# Patient Record
Sex: Female | Born: 1980
Health system: Southern US, Community
[De-identification: ages and names within clinical notes are randomized; demographics above are authoritative.]

## PROBLEM LIST (undated history)

## (undated) ENCOUNTER — Emergency Department (HOSPITAL_COMMUNITY): Payer: Self-pay

## (undated) DIAGNOSIS — G43909 Migraine, unspecified, not intractable, without status migrainosus: Secondary | ICD-10-CM

## (undated) DIAGNOSIS — I499 Cardiac arrhythmia, unspecified: Secondary | ICD-10-CM

## (undated) DIAGNOSIS — N200 Calculus of kidney: Secondary | ICD-10-CM

## (undated) DIAGNOSIS — D649 Anemia, unspecified: Secondary | ICD-10-CM

## (undated) DIAGNOSIS — E039 Hypothyroidism, unspecified: Secondary | ICD-10-CM

## (undated) DIAGNOSIS — N390 Urinary tract infection, site not specified: Secondary | ICD-10-CM

## (undated) HISTORY — DX: Anemia, unspecified: D64.9

## (undated) HISTORY — DX: Urinary tract infection, site not specified: N39.0

## (undated) HISTORY — PX: WISDOM TOOTH EXTRACTION: SHX21

## (undated) HISTORY — DX: Calculus of kidney: N20.0

## (undated) HISTORY — DX: Migraine, unspecified, not intractable, without status migrainosus: G43.909

---

## 2003-08-09 ENCOUNTER — Encounter: Admission: RE | Admit: 2003-08-09 | Discharge: 2003-08-09 | Payer: Self-pay | Admitting: Family Medicine

## 2005-02-19 ENCOUNTER — Other Ambulatory Visit: Admission: RE | Admit: 2005-02-19 | Discharge: 2005-02-19 | Payer: Self-pay | Admitting: Family Medicine

## 2005-03-26 IMAGING — CT CT HEAD W/O CM
1 series · 16 of 26 positions shown, 20 images · IV contrast (agent unspecified)
Comparison: none

CLINICAL DATA: Headaches, visual symptoms. 
 CT HEAD W/O CONTRAST: 
 Routine non-contrast head CT was performed. 

 There is no evidence of intracranial hemorrhage, brain edema, or mass effect. The ventricles are normal. No extra-axial abnormalities are identified. Bone windows show no significant abnormalities.
 IMPRESSION
 Negative non-contrast head CT. 
 ATTN:  Miltraud Pontu

[Series 2: brain · axial · 0.49mm/px · z∈[+72,+194]mm · 16 of 26 slices shown, 20 images]
[im 2/26  brain]
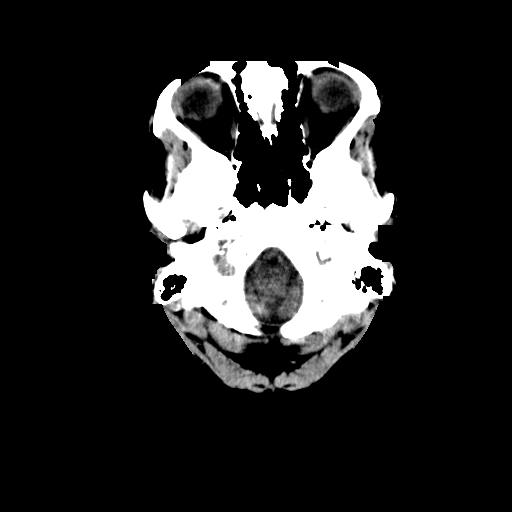
[im 2/26  bone]
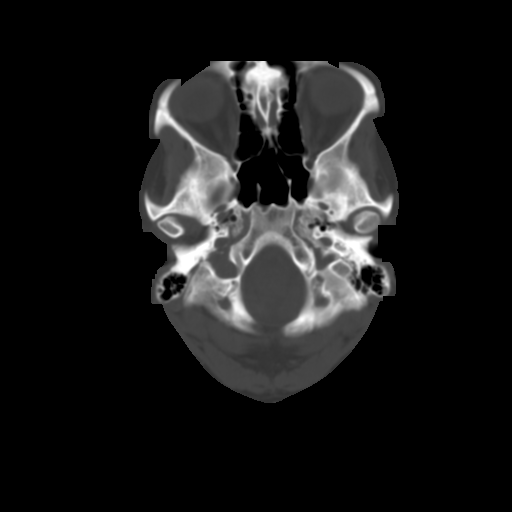
[im 4/26  brain]
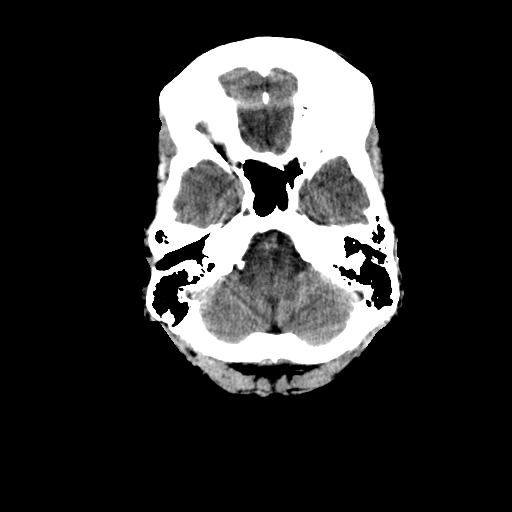
[im 5/26  brain]
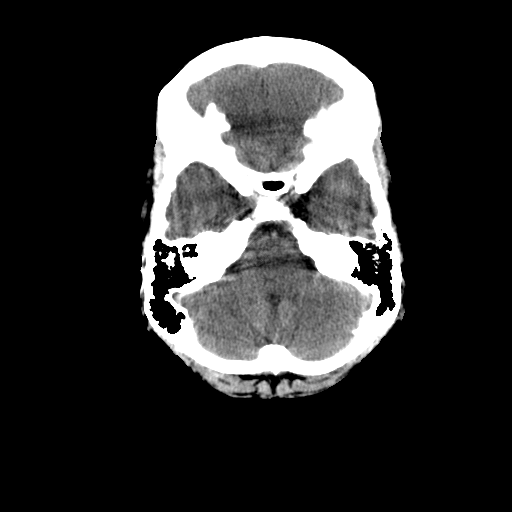
[im 7/26  brain]
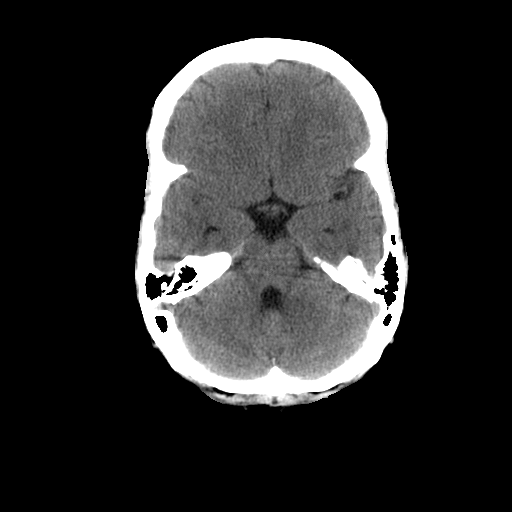
[im 8/26  brain]
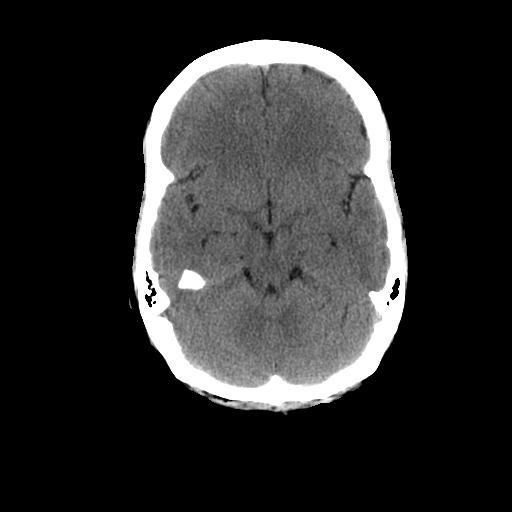
[im 8/26  bone]
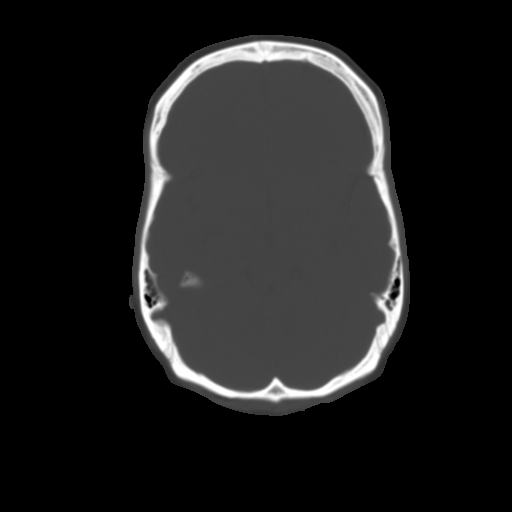
[im 10/26  brain]
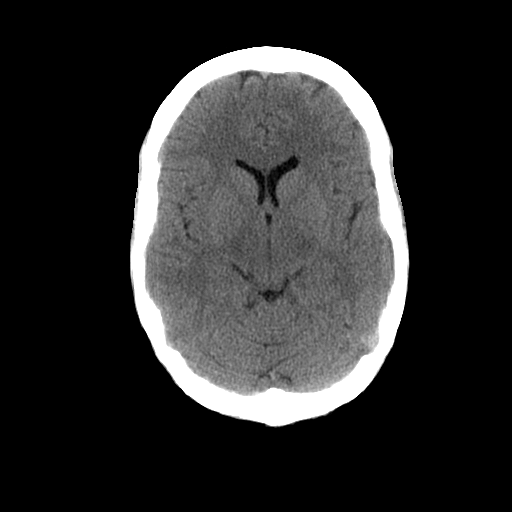
[im 11/26  brain]
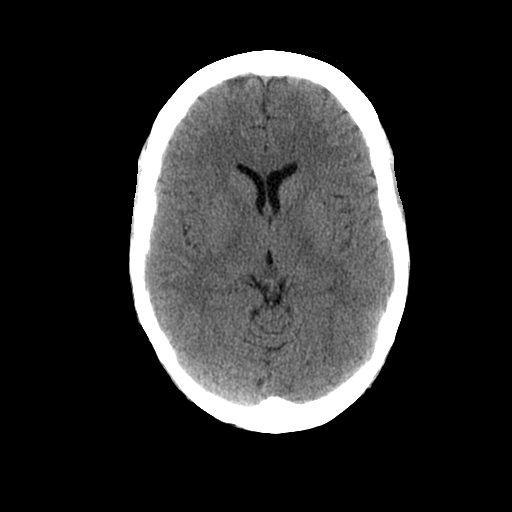
[im 13/26  brain]
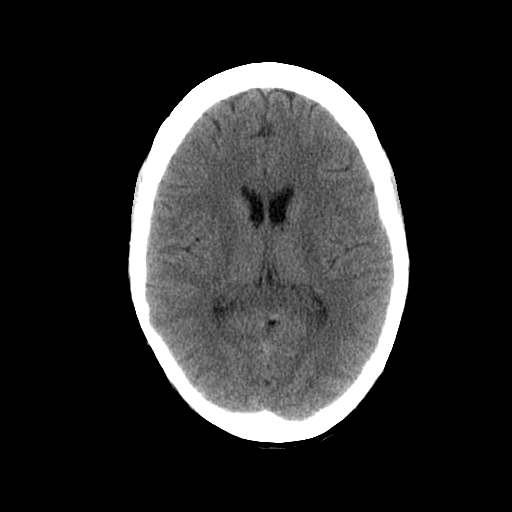
[im 14/26  brain]
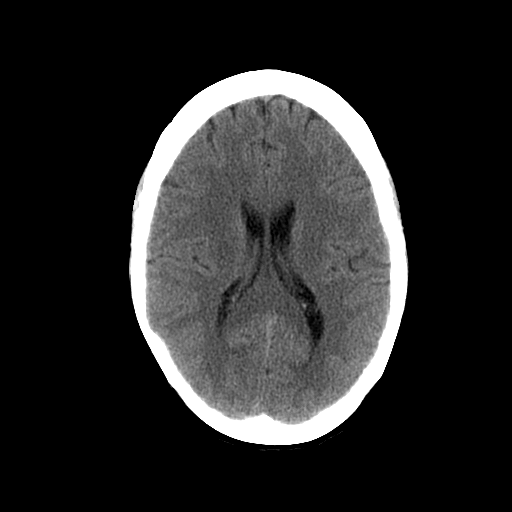
[im 14/26  bone]
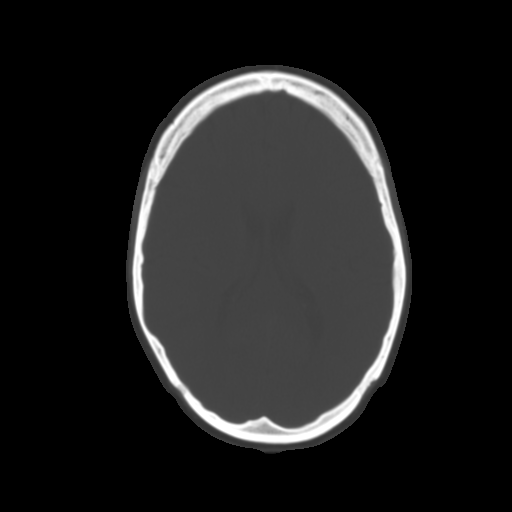
[im 16/26  brain]
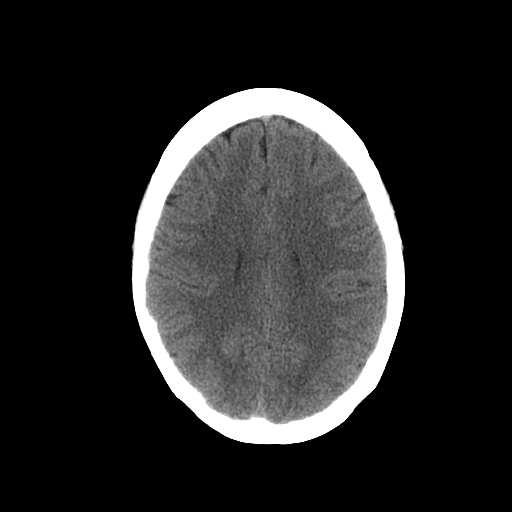
[im 17/26  brain]
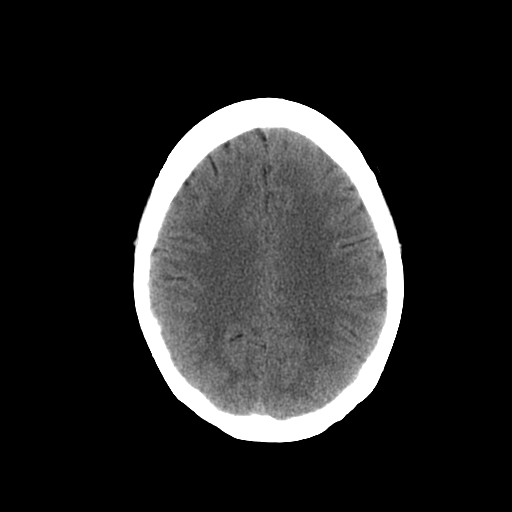
[im 19/26  brain]
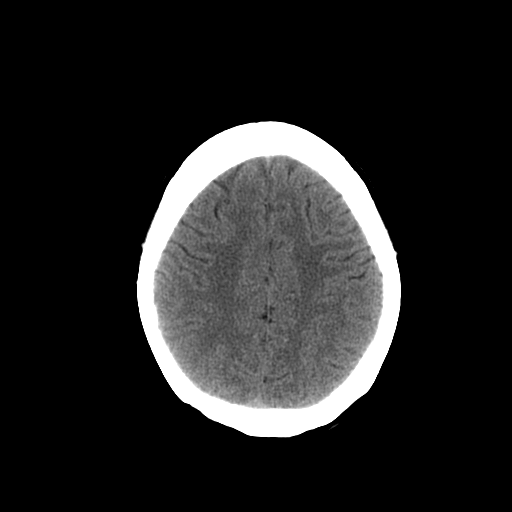
[im 20/26  brain]
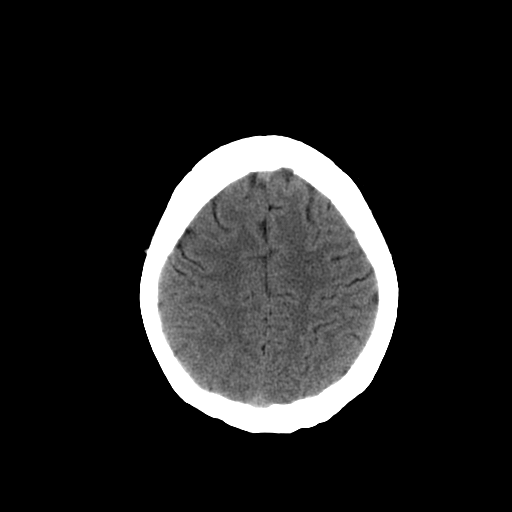
[im 20/26  bone]
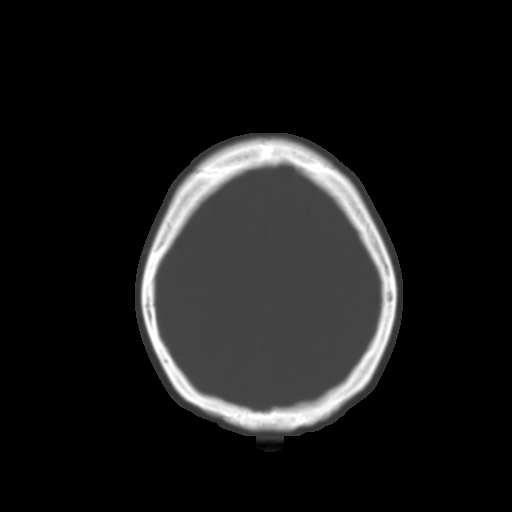
[im 22/26  brain]
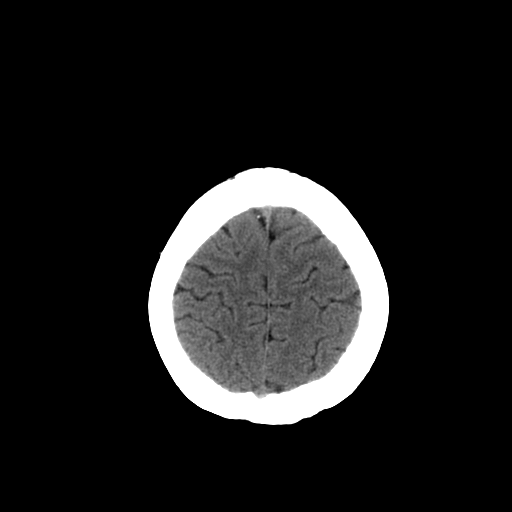
[im 23/26  brain]
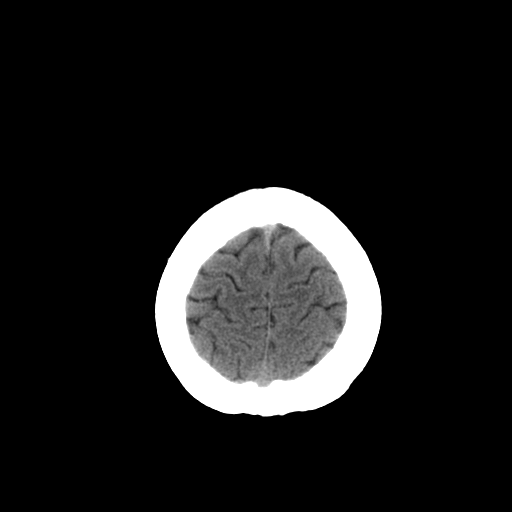
[im 25/26  brain]
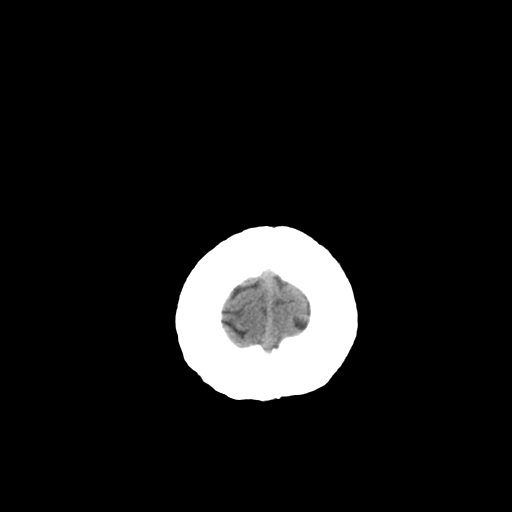

[16 of 26 positions shown; findings below may reference images not displayed]

## 2006-06-25 ENCOUNTER — Other Ambulatory Visit: Admission: RE | Admit: 2006-06-25 | Discharge: 2006-06-25 | Payer: Self-pay | Admitting: Family Medicine

## 2010-07-31 ENCOUNTER — Emergency Department: Payer: Self-pay | Admitting: Emergency Medicine

## 2012-04-24 ENCOUNTER — Emergency Department: Payer: Self-pay | Admitting: Emergency Medicine

## 2014-06-01 ENCOUNTER — Encounter: Payer: Self-pay | Admitting: Family Medicine

## 2014-06-01 ENCOUNTER — Encounter (INDEPENDENT_AMBULATORY_CARE_PROVIDER_SITE_OTHER): Payer: Self-pay

## 2014-06-01 ENCOUNTER — Ambulatory Visit: Payer: Self-pay | Admitting: Family Medicine

## 2014-06-01 ENCOUNTER — Ambulatory Visit (INDEPENDENT_AMBULATORY_CARE_PROVIDER_SITE_OTHER): Payer: BC Managed Care – PPO | Admitting: Family Medicine

## 2014-06-01 VITALS — BP 102/62 | HR 78 | Temp 98.5°F | Ht 69.75 in | Wt 125.5 lb

## 2014-06-01 DIAGNOSIS — R946 Abnormal results of thyroid function studies: Secondary | ICD-10-CM

## 2014-06-01 DIAGNOSIS — N390 Urinary tract infection, site not specified: Secondary | ICD-10-CM | POA: Insufficient documentation

## 2014-06-01 DIAGNOSIS — R7989 Other specified abnormal findings of blood chemistry: Secondary | ICD-10-CM | POA: Insufficient documentation

## 2014-06-01 DIAGNOSIS — Z87442 Personal history of urinary calculi: Secondary | ICD-10-CM | POA: Insufficient documentation

## 2014-06-01 DIAGNOSIS — Z720 Tobacco use: Secondary | ICD-10-CM

## 2014-06-01 DIAGNOSIS — H0012 Chalazion right lower eyelid: Secondary | ICD-10-CM | POA: Insufficient documentation

## 2014-06-01 DIAGNOSIS — H00019 Hordeolum externum unspecified eye, unspecified eyelid: Secondary | ICD-10-CM | POA: Insufficient documentation

## 2014-06-01 DIAGNOSIS — R21 Rash and other nonspecific skin eruption: Secondary | ICD-10-CM

## 2014-06-01 LAB — T4, FREE: Free T4: 0.78 ng/dL (ref 0.60–1.60)

## 2014-06-01 LAB — TSH: TSH: 2.17 u[IU]/mL (ref 0.35–4.50)

## 2014-06-01 MED ORDER — VITAMIN D (ERGOCALCIFEROL) 1.25 MG (50000 UNIT) PO CAPS: ORAL_CAPSULE | ORAL | Status: DC

## 2014-06-01 MED ORDER — TRIAMCINOLONE ACETONIDE 0.1 % EX CREA: 1.0000 "application " | TOPICAL_CREAM | Freq: Two times a day (BID) | CUTANEOUS | Status: DC

## 2014-06-01 NOTE — Assessment & Plan Note (Signed)
Since failed topical lamisil and only mildly pruritic, I question if this is possibly eczema. Advised to d/c Lamisil, eRx sent for Triamcinolone twice daily x 10 days. She will call me with an update. If no improvement, will biopsy and or start stronger antifungal. The patient indicates understanding of these issues and agrees with the plan.

## 2014-06-01 NOTE — Patient Instructions (Addendum)
Chalazion A chalazion is a swelling or hard lump on the eyelid caused by a blocked oil gland. Chalazions may occur on the upper or the lower eyelid.  CAUSES  Oil gland in the eyelid becomes blocked. SYMPTOMS   Swelling or hard lump on the eyelid. This lump may make it hard to see out of the eye.  The swelling may spread to areas around the eye. TREATMENT   Although some chalazions disappear by themselves in 1 or 2 months, some chalazions may need to be removed.  Medicines to treat an infection may be required. HOME CARE INSTRUCTIONS   Wash your hands often and dry them with a clean towel. Do not touch the chalazion.  Apply heat to the eyelid several times a day for 10 minutes to help ease discomfort and bring any yellowish white fluid (pus) to the surface. One way to apply heat to a chalazion is to use the handle of a metal spoon.  Hold the handle under hot water until it is hot, and then wrap the handle in paper towels so that the heat can come through without burning your skin.  Hold the wrapped handle against the chalazion and reheat the spoon handle as needed.  Apply heat in this fashion for 10 minutes, 4 times per day.  Return to your caregiver to have the pus removed if it does not break (rupture) on its own.  Do not try to remove the pus yourself by squeezing the chalazion or sticking it with a pin or needle.  Only take over-the-counter or prescription medicines for pain, discomfort, or fever as directed by your caregiver. SEEK IMMEDIATE MEDICAL CARE IF:   You have pain in your eye.  Your vision changes.  The chalazion does not go away.  The chalazion becomes painful, red, or swollen, grows larger, or does not start to disappear after 2 weeks. MAKE SURE YOU:   Understand these instructions.  Will watch your condition.  Will get help right away if you are not doing well or get worse. Document Released: 06/07/2000 Document Revised: 09/02/2011 Document Reviewed:  09/25/2009 [facility] Patient Information 2015 [place], [place]. This information is not intended to replace advice given to you by your health care provider. Make sure you discuss any questions you have with your health care provider.  Please call your doctor about the chalazion and keep me posted.  Apply triamcinolone twice on the area for no more than 10 days.  Take vitamin D- 1 tab by mouth weekly x 6 weeks, , then continue VitD 1600 IU daily.

## 2014-06-01 NOTE — Assessment & Plan Note (Signed)
New-Will treat with high dose supplementation- 50,000 IU weekly x 6 weeks, then 1600 IU daily. The patient indicates understanding of these issues and agrees with the plan.

## 2014-06-01 NOTE — Progress Notes (Signed)
Pre visit review using our clinic review tool, if applicable. No additional management support is needed unless otherwise documented below in the visit note. 

## 2014-06-01 NOTE — Assessment & Plan Note (Signed)
New- advised follow up with eye doctor or derm for removal. Given duration of symptoms, will likely need removal at this point since it has not been resolving. The patient indicates understanding of these issues and agrees with the plan. She will call directly to make an appt since she is already established.

## 2014-06-01 NOTE — Assessment & Plan Note (Signed)
New- recheck thyroid panel today. Has been complaining of dry skin and fatigue. Orders Placed This Encounter  Procedures  . TSH  . T4, Free

## 2014-06-01 NOTE — Progress Notes (Signed)
Subjective:   Patient ID: Nicole MarekAmanda G [name], female    DOB: [birth year], 33 y.o.   MRN: [id number]  Nicole Marekmanda G [name] is a pleasant 33 y.o. year old female who presents to clinic today with Establish Care; Stye; and Rash  on 06/01/2014  HPI: Right "eye stye"- ongoing for over a year- firm bump on bottom of eye lid- sometimes red and warm but currently is not.  No pain in her eye, no eye redness, no photophobia.  Rash on her back- has been there since 07/2013.  Was told by previous pcp to use OTC lamisil.  Has had no improvement.  Intermittently itchy. Has not spread.  Has it no where else on her body.  Brings labs in from 10/2013 has has question.  High TSH- TSH was 5.9.  She has noticed dried lips and skin.  No other symptoms of hyper or hypothyroidism.  Vit D 25.7- no replacement was recommended.  No current outpatient prescriptions on file prior to visit.   No current facility-administered medications on file prior to visit.    No Known Allergies  Past Medical History  Diagnosis Date  . Kidney stones   . Migraines   . UTI (urinary tract infection)     Past Surgical History  Procedure Laterality Date  . Wisdom tooth extraction      Family History  Problem Relation Age of Onset  . Hypertension Mother     History   Social History  . Marital Status: Married    Spouse Name: N/A    Number of Children: N/A  . Years of Education: N/A   Occupational History  . Not on file.   Social History Main Topics  . Smoking status: Current Every Day Smoker  . Smokeless tobacco: Never Used  . Alcohol Use: Yes  . Drug Use: No  . Sexual Activity: Yes   Other Topics Concern  . Not on file   Social History Narrative  . No narrative on file   The PMH, PSH, Social History, Family History, Medications, and allergies [facility], and have been updated if relevant.   Review of Systems  Constitutional: Positive for fatigue. Negative for fever.  HENT: Negative.     Eyes: Negative for photophobia, pain, discharge, redness, itching and visual disturbance.  Respiratory: Negative.   Cardiovascular: Negative.   Gastrointestinal: Negative.   Endocrine: Negative.   Musculoskeletal: Negative.   Skin: Positive for rash.  Allergic/Immunologic: Negative.   Neurological: Negative.   Hematological: Negative.   Psychiatric/Behavioral: Negative.   All other systems reviewed and are negative.      Objective:    BP 102/62 mmHg  Pulse 78  Temp(Src) 98.5 F (36.9 C) (Oral)  Ht 5' 9.75" (1.772 m)  Wt 125 lb 8 oz (56.926 kg)  BMI 18.13 kg/m2  SpO2 99%   Physical Exam  Constitutional: She is oriented to person, place, and time. She appears well-developed and well-nourished. No distress.  HENT:  Head: Normocephalic.  Eyes: Conjunctivae and EOM are normal. Pupils are equal, round, and reactive to light. Right eye exhibits no chemosis, no discharge and no exudate. Left eye exhibits no chemosis, no discharge and no exudate. No scleral icterus.    Neck: Normal range of motion. Neck supple.  Cardiovascular: Normal rate, regular rhythm and normal heart sounds.   Pulmonary/Chest: Effort normal and breath sounds normal.  Musculoskeletal: Normal range of motion.  Lymphadenopathy:    She has no cervical adenopathy.  Neurological: She  is alert and oriented to person, place, and time. No cranial nerve deficit.  Skin: Skin is warm and dry.     Psychiatric: She has a normal mood and affect. Her speech is normal and behavior is normal. Judgment and thought content normal. Cognition and memory are normal.          Assessment & Plan:   Rash and nonspecific skin eruption  Stye, unspecified laterality  Tobacco abuse  History of nephrolithiasis  Frequent UTI  Chalazion of right lower eyelid  Elevated TSH - Plan: TSH, T4, Free  Low serum vitamin D No Follow-up on file.

## 2014-06-02 ENCOUNTER — Encounter: Payer: Self-pay | Admitting: *Deleted

## 2014-06-07 ENCOUNTER — Encounter: Payer: Self-pay | Admitting: Family Medicine

## 2014-06-08 ENCOUNTER — Ambulatory Visit: Payer: Self-pay | Admitting: Family Medicine

## 2014-06-09 ENCOUNTER — Ambulatory Visit: Payer: Self-pay | Admitting: Family Medicine

## 2014-08-04 ENCOUNTER — Ambulatory Visit: Payer: BC Managed Care – PPO | Admitting: Family Medicine

## 2014-10-12 ENCOUNTER — Telehealth: Payer: Self-pay | Admitting: Family Medicine

## 2014-10-12 NOTE — Telephone Encounter (Signed)
Patient called to schedule a pap smear and she wants to know if Dr. Dayton MartesAron wants her to have a physical and lab work,also.  Please contact patient with response.

## 2014-10-13 NOTE — Telephone Encounter (Signed)
Yes I would advise that she have a CPX with labs as well.

## 2014-10-13 NOTE — Telephone Encounter (Signed)
Spoke to pt and advised per Dr Dayton MartesAron; pt verbally expressed understanding and was already scheduled for CPE

## 2014-11-02 ENCOUNTER — Encounter: Payer: Self-pay | Admitting: Family Medicine

## 2014-11-02 ENCOUNTER — Other Ambulatory Visit (HOSPITAL_COMMUNITY)
Admission: RE | Admit: 2014-11-02 | Discharge: 2014-11-02 | Disposition: A | Discharge status: Home / Self Care | Payer: BLUE CROSS/BLUE SHIELD | Source: Ambulatory Visit | Attending: Family Medicine | Admitting: Family Medicine

## 2014-11-02 ENCOUNTER — Ambulatory Visit (INDEPENDENT_AMBULATORY_CARE_PROVIDER_SITE_OTHER): Payer: BLUE CROSS/BLUE SHIELD | Admitting: Family Medicine

## 2014-11-02 VITALS — BP 112/66 | HR 87 | Temp 98.3°F | Ht 70.25 in | Wt 126.0 lb

## 2014-11-02 DIAGNOSIS — R7989 Other specified abnormal findings of blood chemistry: Secondary | ICD-10-CM

## 2014-11-02 DIAGNOSIS — Z124 Encounter for screening for malignant neoplasm of cervix: Secondary | ICD-10-CM | POA: Insufficient documentation

## 2014-11-02 DIAGNOSIS — Z113 Encounter for screening for infections with a predominantly sexual mode of transmission: Secondary | ICD-10-CM | POA: Diagnosis present

## 2014-11-02 DIAGNOSIS — Z0000 Encounter for general adult medical examination without abnormal findings: Secondary | ICD-10-CM | POA: Diagnosis not present

## 2014-11-02 DIAGNOSIS — Z01419 Encounter for gynecological examination (general) (routine) without abnormal findings: Secondary | ICD-10-CM

## 2014-11-02 DIAGNOSIS — Z1151 Encounter for screening for human papillomavirus (HPV): Secondary | ICD-10-CM | POA: Insufficient documentation

## 2014-11-02 DIAGNOSIS — Z114 Encounter for screening for human immunodeficiency virus [HIV]: Secondary | ICD-10-CM

## 2014-11-02 DIAGNOSIS — N760 Acute vaginitis: Secondary | ICD-10-CM | POA: Diagnosis present

## 2014-11-02 DIAGNOSIS — K580 Irritable bowel syndrome with diarrhea: Secondary | ICD-10-CM | POA: Diagnosis not present

## 2014-11-02 DIAGNOSIS — R197 Diarrhea, unspecified: Secondary | ICD-10-CM

## 2014-11-02 LAB — COMPREHENSIVE METABOLIC PANEL
ALBUMIN: 4 g/dL (ref 3.5–5.2)
ALT: 12 U/L (ref 0–35)
AST: 26 U/L (ref 0–37)
Alkaline Phosphatase: 65 U/L (ref 39–117)
BUN: 11 mg/dL (ref 6–23)
CALCIUM: 9.5 mg/dL (ref 8.4–10.5)
CHLORIDE: 105 meq/L (ref 96–112)
CO2: 27 meq/L (ref 19–32)
Creatinine, Ser: 0.61 mg/dL (ref 0.40–1.20)
GFR: 117.77 mL/min (ref >60.00)
GLUCOSE: 83 mg/dL (ref 70–99)
POTASSIUM: 3.9 meq/L (ref 3.5–5.1)
SODIUM: 140 meq/L (ref 135–145)
TOTAL PROTEIN: 6.8 g/dL (ref 6.0–8.3)
Total Bilirubin: 0.4 mg/dL (ref 0.2–1.2)

## 2014-11-02 LAB — CBC WITH DIFFERENTIAL/PLATELET
BASOS ABS: 0 10*3/uL (ref 0.0–0.1)
BASOS PCT: 0.5 % (ref 0.0–3.0)
EOS ABS: 0.1 10*3/uL (ref 0.0–0.7)
Eosinophils Relative: 2.2 % (ref 0.0–5.0)
HCT: 36.2 % (ref 36.0–46.0)
Hemoglobin: 12 g/dL (ref 12.0–15.0)
LYMPHS PCT: 40.1 % (ref 12.0–46.0)
Lymphs Abs: 1.8 10*3/uL (ref 0.7–4.0)
MCHC: 33.2 g/dL (ref 30.0–36.0)
MCV: 114.1 fl — AB (ref 78.0–100.0)
MONO ABS: 0.3 10*3/uL (ref 0.1–1.0)
Monocytes Relative: 6.8 % (ref 3.0–12.0)
NEUTROS PCT: 50.4 % (ref 43.0–77.0)
Neutro Abs: 2.3 10*3/uL (ref 1.4–7.7)
PLATELETS: 239 10*3/uL (ref 150.0–400.0)
RBC: 3.17 Mil/uL — AB (ref 3.87–5.11)
RDW: 14.8 % (ref 11.5–15.5)
WBC: 4.5 10*3/uL (ref 4.0–10.5)

## 2014-11-02 LAB — LDL CHOLESTEROL, DIRECT: Direct LDL: 47 mg/dL

## 2014-11-02 LAB — LIPID PANEL
Cholesterol: 156 mg/dL (ref 0–200)
HDL: 64.4 mg/dL (ref >39.00)
NonHDL: 91.6
TRIGLYCERIDES: 376 mg/dL — AB (ref 0.0–149.0)
Total CHOL/HDL Ratio: 2
VLDL: 75.2 mg/dL — AB (ref 0.0–40.0)

## 2014-11-02 LAB — TSH: TSH: 4.73 u[IU]/mL — AB (ref 0.35–4.50)

## 2014-11-02 MED ORDER — LOPERAMIDE HCL 2 MG PO CAPS: ORAL_CAPSULE | ORAL | Status: DC

## 2014-11-02 NOTE — Addendum Note (Signed)
Addended by: Desmond DikeKNIGHT, Linton Stolp H on: 11/02/2014 11:28 AM   Modules accepted: Orders

## 2014-11-02 NOTE — Progress Notes (Signed)
Subjective:   Patient ID: Nicole MarekAmanda G [name], female    DOB: 1980/12/10, 34 y.o.   MRN: [id number]  Nicole Marekmanda G [name] is a pleasant 34 y.o. year old female who presents to clinic today with Annual Exam and Diarrhea  on 11/02/2014  HPI:  I have not seen her since she established care with me in 05/2014.  Last pap smear several years ago. No h/o abnormal pap smear within past 5 years. Does have irregular periods- stopped OCPs/hormone patch because she is a smoker. Not ready to quit.  No family history of breast, uterine, or cervical cancer.  Diarrhea- long term issue- ongoing for years.  On most days, approximately 10 minutes after she eats, feels urgency to have BM.  BM is soft/loose but not typically watery.  Worsened by anxiety and dairy.  Not sure if anything makes it better.  Denies any bloody or black stools.  Has not noticed any mucous in her stools. Lab Results  Component Value Date   TSH 2.17 06/01/2014   No results found for: CHOL, HDL, LDLCALC, LDLDIRECT, TRIG, CHOLHDL   No current outpatient prescriptions on file prior to visit.   No current facility-administered medications on file prior to visit.    No Known Allergies  Past Medical History  Diagnosis Date  . Kidney stones   . Migraines   . UTI (urinary tract infection)     Past Surgical History  Procedure Laterality Date  . Wisdom tooth extraction      Family History  Problem Relation Age of Onset  . Hypertension Mother     History   Social History  . Marital Status: Married    Spouse Name: N/A  . Number of Children: N/A  . Years of Education: N/A   Occupational History  . Not on file.   Social History Main Topics  . Smoking status: Current Every Day Smoker  . Smokeless tobacco: Never Used  . Alcohol Use: Yes  . Drug Use: No  . Sexual Activity: Yes   Other Topics Concern  . Not on file   Social History Narrative   The PMH, PSH, Social History, Family History, Medications, and allergies  [facility], and have been updated if relevant.  Review of Systems  Constitutional: Negative.   HENT: Negative.   Respiratory: Negative.   Cardiovascular: Negative.   Gastrointestinal: Positive for diarrhea. Negative for nausea, vomiting, abdominal pain, constipation, blood in stool, abdominal distention, anal bleeding and rectal pain.  Endocrine: Negative.   Genitourinary: Positive for menstrual problem. Negative for dysuria, urgency, hematuria, decreased urine volume, vaginal bleeding, vaginal discharge, enuresis and vaginal pain.  Musculoskeletal: Negative.   Skin: Negative.   Allergic/Immunologic: Negative.   Neurological: Negative.   Hematological: Negative.   Psychiatric/Behavioral: Negative.   All other systems reviewed and are negative.      Objective:    BP 112/66 mmHg  Pulse 87  Temp(Src) 98.3 F (36.8 C) (Oral)  Ht 5' 10.25" (1.784 m)  Wt 126 lb (57.153 kg)  BMI 17.96 kg/m2  SpO2 99%  LMP 10/26/2014 (Within Days)   Physical Exam    General:  Well-developed,well-nourished,in no acute distress; alert,appropriate and cooperative throughout examination Head:  normocephalic and atraumatic.   Eyes:  vision grossly intact, pupils equal, pupils round, and pupils reactive to light.   Ears:  R ear normal and L ear normal.   Nose:  no external deformity.   Mouth:  good dentition.   Neck:  No  deformities, masses, or tenderness noted. Breasts:  No mass, nodules, thickening, tenderness, bulging, retraction, inflamation, nipple discharge or skin changes noted.   Lungs:  Normal respiratory effort, chest expands symmetrically. Lungs are clear to auscultation, no crackles or wheezes. Heart:  Normal rate and regular rhythm. S1 and S2 normal without gallop, murmur, click, rub or other extra sounds. Abdomen:  Bowel sounds positive,abdomen soft and non-tender without masses, organomegaly or hernias noted. Rectal:  no external abnormalities.   Genitalia:  Pelvic  Exam:        External: normal female genitalia without lesions or masses        Vagina: normal without lesions or masses        Cervix: normal without lesions or masses        Adnexa: normal bimanual exam without masses or fullness        Uterus: normal by palpation        Pap smear: performed Msk:  No deformity or scoliosis noted of thoracic or lumbar spine.   Extremities:  No clubbing, cyanosis, edema, or deformity noted with normal full range of motion of all joints.   Neurologic:  alert & oriented X3 and gait normal.   Skin:  Intact without suspicious lesions or rashes Cervical Nodes:  No lymphadenopathy noted Axillary Nodes:  No palpable lymphadenopathy Psych:  Cognition and judgment appear intact. Alert and cooperative with normal attention span and concentration. No apparent delusions, illusions, hallucinations      Assessment & Plan:   Diarrhea  Well woman exam with routine gynecological exam - Plan: CBC with Differential/Platelet, Comprehensive metabolic panel, Lipid panel, TSH  Screening for HIV (human immunodeficiency virus) - Plan: HIV antibody (with reflex)  Screening for STD (sexually transmitted disease) - Plan: RPR, HSV(herpes simplex vrs) 1+2 ab-IgM No Follow-up on file.

## 2014-11-02 NOTE — Assessment & Plan Note (Signed)
Symptoms consistent with IBS- D. She is not currently having any red flag symptoms. Advised continuing to limit dairy and eRx sent for loperamide 2 mg to use 45 minutes prior to meals. Call or return to clinic prn if these symptoms worsen or fail to improve as anticipated. Will refer to GI if symptoms persist. The patient indicates understanding of these issues and agrees with the plan.

## 2014-11-02 NOTE — Progress Notes (Signed)
Pre visit review using our clinic review tool, if applicable. No additional management support is needed unless otherwise documented below in the visit note. 

## 2014-11-02 NOTE — Assessment & Plan Note (Signed)
Reviewed preventive care protocols, scheduled due services, and updated immunizations Discussed nutrition, exercise, diet, and healthy lifestyle.  Pap smear done today.  Labs as well.  Orders Placed This Encounter  Procedures  . HIV antibody (with reflex)  . RPR  . CBC with Differential/Platelet  . Comprehensive metabolic panel  . Lipid panel  . TSH  . HSV(herpes simplex vrs) 1+2 ab-IgM

## 2014-11-02 NOTE — Patient Instructions (Signed)
Diet and Irritable Bowel Syndrome  No cure has been found for irritable bowel syndrome (IBS). Many options are available to treat the symptoms. Your caregiver will give you the best treatments available for your symptoms. He or she will also encourage you to manage stress and to make changes to your diet. You need to work with your caregiver and Registered Dietician to find the best combination of medicine, diet, counseling, and support to control your symptoms. The following are some diet suggestions. FOODS THAT MAKE IBS WORSE  Fatty foods, such as French fries.  Milk products, such as cheese or ice cream.  Chocolate.  Alcohol.  Caffeine (found in coffee and some sodas).  Carbonated drinks, such as soda. If certain foods cause symptoms, you should eat less of them or stop eating them. FOOD JOURNAL   Keep a journal of the foods that seem to cause distress. Write down:  What you are eating during the day and when.  What problems you are having after eating.  When the symptoms occur in relation to your meals.  What foods always make you feel badly.  Take your notes with you to your caregiver to see if you should stop eating certain foods. FOODS THAT MAKE IBS BETTER Fiber reduces IBS symptoms, especially constipation, because it makes stools soft, bulky, and easier to pass. Fiber is found in bran, bread, cereal, beans, fruit, and vegetables. Examples of foods with fiber include:  Apples.  Peaches.  Pears.  Berries.  Figs.  Broccoli, raw.  Cabbage.  Carrots.  Raw peas.  Kidney beans.  Lima beans.  Whole-grain bread.  Whole-grain cereal. Add foods with fiber to your diet a little at a time. This will let your body get used to them. Too much fiber at once might cause gas and swelling of your abdomen. This can trigger symptoms in a person with IBS. Caregivers usually recommend a diet with enough fiber to produce soft, painless bowel movements. High fiber diets may  cause gas and bloating. However, these symptoms often go away within a few weeks, as your body adjusts. In many cases, dietary fiber may lessen IBS symptoms, particularly constipation. However, it may not help pain or diarrhea. High fiber diets keep the colon mildly enlarged (distended) with the added fiber. This may help prevent spasms in the colon. Some forms of fiber also keep water in the stool, thereby preventing hard stools that are difficult to pass.  Besides telling you to eat more foods with fiber, your caregiver may also tell you to get more fiber by taking a fiber pill or drinking water mixed with a special high fiber powder. An example of this is a natural fiber laxative containing psyllium seed.  TIPS  Large meals can cause cramping and diarrhea in people with IBS. If this happens to you, try eating 4 or 5 small meals a day, or try eating less at each of your usual 3 meals. It may also help if your meals are low in fat and high in carbohydrates. Examples of carbohydrates are pasta, rice, whole-grain breads and cereals, fruits, and vegetables.  If dairy products cause your symptoms to flare up, you can try eating less of those foods. You might be able to handle yogurt better than other dairy products, because it contains bacteria that helps with digestion. Dairy products are an important source of calcium and other nutrients. If you need to avoid dairy products, be sure to talk with a Registered Dietitian about getting these nutrients   through other food sources.  Drink enough water and fluids to keep your urine clear or pale yellow. This is important, especially if you have diarrhea. FOR MORE INFORMATION  International Foundation for Functional Gastrointestinal Disorders: www.iffgd.org  National Digestive Diseases Information Clearinghouse: digestive.niddk.nih.gov Document Released: 08/31/2003 Document Revised: 09/02/2011 Document Reviewed: 09/10/2013 ExitCare Patient Information 2015  [employer]. This information is not intended to replace advice given to you by your health care provider. Make sure you discuss any questions you have with your health care provider.  

## 2014-11-03 LAB — CYTOLOGY - PAP

## 2014-11-03 LAB — HIV ANTIBODY (ROUTINE TESTING W REFLEX): HIV: NONREACTIVE

## 2014-11-03 LAB — RPR

## 2014-11-04 ENCOUNTER — Other Ambulatory Visit: Payer: Self-pay | Admitting: Family Medicine

## 2014-11-04 DIAGNOSIS — R7989 Other specified abnormal findings of blood chemistry: Secondary | ICD-10-CM

## 2014-11-04 DIAGNOSIS — D7589 Other specified diseases of blood and blood-forming organs: Secondary | ICD-10-CM

## 2014-11-04 LAB — CERVICOVAGINAL ANCILLARY ONLY: Candida vaginitis: NEGATIVE

## 2014-11-04 LAB — HSV(HERPES SIMPLEX VRS) I + II AB-IGM: HERPES SIMPLEX VRS I-IGM AB (EIA): 1.09 {index}

## 2014-11-09 LAB — CERVICOVAGINAL ANCILLARY ONLY: HERPES (WINDOWPATH): NEGATIVE

## 2015-10-04 ENCOUNTER — Telehealth: Payer: Self-pay | Admitting: Family Medicine

## 2015-10-04 NOTE — Telephone Encounter (Signed)
Pt has appt to see Dr Dayton MartesAron on 10/09/15 at 9 AM.

## 2015-10-04 NOTE — Telephone Encounter (Signed)
PLEASE NOTE: All timestamps contained within this report are represented as [place] Standard Time. CONFIDENTIALTY NOTICE: This fax transmission is intended only for the addressee. It contains information that is legally privileged, confidential or otherwise protected from use or disclosure. If you are not the intended recipient, you are strictly prohibited from reviewing, disclosing, copying using or disseminating any of this information or taking any action in reliance on or regarding this information. If you have received this fax in error, please notify us immediately by telephone so that we can arrange for its return to [place]. Phone: [phone], Toll-Free: [phone], Fax: [phone] Page: 1 of 2 Call Id: [id number] [facility] Day - Client TELEPHONE ADVICE RECORD [facility] Patient Name: Nicole [name] Gender: Female DOB: 1981-01-22 Age: 35 Y 2 M 15 D Return Phone Number: [phone] (Primary) Address: [address] City/State/Zip: [place] [address] Client [facility] Day - Client Client Site [facility] [place] - Day Physician Ruthe Mannan Contact Type Call Who Is Calling Patient / Member / Family / Caregiver Call Type Triage / Clinical Relationship To Patient Self Return Phone Number [phone] (Primary) Chief Complaint Heart palpitations or irregular heartbeat Reason for Call Symptomatic / Request for Health Information Initial Comment Caller states wants appt w/Dr Dayton Martes; has rapid heart rate every now and again; past 3 wks swelling in ankles and feet; never goes completely away but better after having slept the night; worsens as the day progresses; also revisit IBS conversation had year ago; recommendations are not working; Appointment Disposition EMR Appointment Scheduled Info pasted into Epic Yes PreDisposition Call Doctor Translation No Nurse Assessment Nurse: Logan Bores, RN, Melissa  Date/Time (Eastern Time): 10/04/2015 3:02:27 PM Confirm and document reason for call. If symptomatic, describe symptoms. You must click the next button to save text entered. ---Caller states wants appt w/Dr Dayton Martes; has rapid heart rate every now and again; past 3 wks swelling in ankles and feet; never goes completely away but better after having slept the night; worsens as the day progresses; also revisit IBS conversation had year ago; recommendations are not working; Has the patient traveled out of the country within the last 30 days? ---Not Applicable Does the patient have any new or worsening symptoms? ---Yes Will a triage be completed? ---Yes Related visit to physician within the last 2 weeks? ---No Does the PT have any chronic conditions? (i.e. diabetes, asthma, etc.) ---No Is the patient pregnant or possibly pregnant? (Ask all females between the ages of 27-55) ---No Is this a behavioral health or substance abuse call? ---No PLEASE NOTE: All timestamps contained within this report are represented as [place] Standard Time. CONFIDENTIALTY NOTICE: This fax transmission is intended only for the addressee. It contains information that is legally privileged, confidential or otherwise protected from use or disclosure. If you are not the intended recipient, you are strictly prohibited from reviewing, disclosing, copying using or disseminating any of this information or taking any action in reliance on or regarding this information. If you have received this fax in error, please notify us immediately by telephone so that we can arrange for its return to [place]. Phone: [phone], Toll-Free: [phone], Fax: [phone] Page: 2 of 2 Call Id: [id number] Guidelines Guideline Title Affirmed Question Affirmed Notes Nurse Date/Time Lamount Cohen Time) Heart Rate and Heartbeat Questions Palpitations are a chronic symptom (recurrent or ongoing AND present > 4 weeks) Logan Bores, RN, Melissa 10/04/2015  3:03:16 PM Disp. Time Lamount Cohen Time) Disposition Final User 10/04/2015 3:11:33  PM See PCP within 2 Weeks Yes Logan BoresEvans, RN, Efraim KaufmannMelissa Caller Understands: Yes Disagree/Comply: Comply Care Advice Given Per Guideline SEE PCP WITHIN 2 WEEKS: You need an evaluation for this ongoing problem within the next 2 weeks. Call your doctor during regular office hours and make an appointment. REASSURANCE: * Everybody experiences palpitations at some point in their lives. In many circumstances it is simply a heightened awareness of the heart's normal beating. * Patients with anxiety or stress may describe a 'rapid heart beat' or 'pounding' in their chest from their heart beating. * Occasional extra heart beats are experienced by most everyone. Lack of sleep, stress, and caffeinated beverages can aggravate this condition. HEALTH BASICS: * Sleep - Try to get sufficient amount of sleep. Lack of sleep can aggravate palpitations. Most people need 7-8 hours of sleep each night. * Regular exercise will improve your overall health, improve your mood, and is a simple method to reduce stress. * Drink adequate liquids - 6-8 glasses of water daily. * Eat a balanced healthy diet. AVOID CAFFEINE: * Avoid caffeine-containing beverages (Reason: caffeine is a stimulant and can aggravate palpitations). * Examples include coffee, tea, colas, [address]ountain Dew, Bed Bath & Beyonded Bull, and some 'energy drinks'. LIMIT ALCOHOL: Limit your alcohol consumption to no more than 2 drinks a day. Ideally, eliminate alcohol entirely for the next two weeks. * For smokers - Stop or reduce your smoking OTHER: * Avoid diet pills (Reason: they act as stimulants.) CALL BACK IF: * You become worse. Comments User: Ardeen GarlandMelissa, Evans, RN Date/Time Lamount Cohen(Eastern Time): 10/04/2015 3:20:03 PM Caller notified of appt. 10/09/15 @ 9am Dr. Dayton MartesAron. Caller verb. understood. Referrals REFERRED TO PCP OFFICE REFERRED TO PCP OFFICE REFERRED TO PCP OFFICE

## 2015-10-04 NOTE — Telephone Encounter (Signed)
[facility] Day - Client TELEPHONE ADVICE RECORD   [facility]     Patient Name: [facility]ull Client [facility] Day - Client    Client Site [facility] [place] - Day    Physician Ruthe Mannan     Contact Type Call    Who Is Calling Patient / Member / Family / Caregiver    Call Type Triage / Clinical    Relationship To Patient Self  Gender: Female Return Phone Number [phone] (Primary)  DOB: [birth year]  Chief Complaint Heart palpitations or irregular heartbeat  Age: 35 Y 2 M 15 D Reason for Call Symptomatic / Request for Health Information  Return Phone Number: [phone] (Primary) Initial Comment Caller states wants appt w/Dr Dayton Martes; has rapid heart rate every now and again; past 3 wks swelling in ankles and feet; never goes completely away but better after having slept the night; worsens as the day progresses; also revisit IBS conversation had year ago; recommendations are not working;   Address: [address]  PreDisposition Call Doctor  City/State/Zip: [place] [address] Translation No    Nurse Assessment  Nurse: Logan Bores, RN, Melissa Date/Time (Eastern Time): 10/04/2015 3:02:27 PM  Confirm and document reason for call. If symptomatic, describe symptoms. You must click the next button to save text entered. ---Caller states wants appt w/Dr Dayton Martes; has rapid heart rate every now and again; past 3 wks swelling in ankles and feet; never goes completely away but better after having slept the night; worsens as the day progresses; also revisit IBS conversation had year ago; recommendations are not working;  Has the patient traveled out of the country within the last 30 days? ---Not Applicable  Does the patient have any new or worsening symptoms? ---Yes  Will a triage be completed? ---Yes  Related visit to physician within the last 2 weeks? ---No  Does the PT have any chronic conditions? (i.e. diabetes, asthma,  etc.) ---No  Is the patient pregnant or possibly pregnant? (Ask all females between the ages of 20-55) ---No  Is this a behavioral health or substance abuse call? ---No    Guidelines      Guideline Title Affirmed Question Affirmed Notes Nurse Date/Time (Eastern Time)  Heart Rate and Heartbeat Questions Palpitations are a chronic symptom (recurrent or ongoing AND present > 4 weeks)  Logan Bores, RN, Melissa 10/04/2015 3:03:16 PM  Disp. Time Lamount Cohen Time) Disposition Final User         10/04/2015 3:11:33 PM See PCP within 2 Weeks Yes Logan Bores, RN, Efraim Kaufmann         Caller Understands: Yes   Disagree/Comply: Comply      Care Advice Given Per Guideline         SEE PCP WITHIN 2 WEEKS: You need an evaluation for this ongoing problem within the next 2 weeks. Call your doctor during regular office hours and make an appointment. REASSURANCE: * Everybody experiences palpitations at some point in their lives. In many circumstances it is simply a heightened awareness of the heart's normal beating. * Patients with anxiety or stress may describe a 'rapid heart beat' or 'pounding' in their chest from their heart beating. * Occasional extra heart beats are experienced by most everyone. Lack of sleep, stress, and caffeinated beverages can aggravate this condition. HEALTH BASICS: * Sleep - Try to get sufficient amount of sleep. Lack of sleep can aggravate palpitations. Most people need 7-8 hours of sleep each  night. * Regular exercise will improve your overall health, improve your mood, and is a simple method to reduce stress. * Drink adequate liquids - 6-8 glasses of water daily. * Eat a balanced healthy diet. AVOID CAFFEINE: * Avoid caffeine-containing beverages (Reason: caffeine is a stimulant and can aggravate palpitations). * Examples include coffee, tea, colas, [address]ountain Dew, Bed Bath & Beyonded Bull, and some 'energy drinks'. LIMIT ALCOHOL: Limit your alcohol consumption to no more than 2 drinks a day. Ideally, eliminate alcohol entirely  for the next two weeks. * For smokers - Stop or reduce your smoking OTHER: * Avoid diet pills (Reason: they act as stimulants.) CALL BACK IF: * You become worse.             Referrals   REFERRED TO PCP OFFICE   REFERRED TO PCP OFFICE

## 2015-10-09 ENCOUNTER — Telehealth: Payer: Self-pay | Admitting: Family Medicine

## 2015-10-09 ENCOUNTER — Ambulatory Visit: Payer: Self-pay | Admitting: Family Medicine

## 2015-10-09 DIAGNOSIS — Z0289 Encounter for other administrative examinations: Secondary | ICD-10-CM

## 2015-10-09 NOTE — Telephone Encounter (Signed)
Patient did not come for their scheduled appointment today for  heart palpatations, ankle swelling, IBS concerns Please let me know if the patient needs to be contacted immediately for follow up or if no follow up is necessary.

## 2015-10-10 NOTE — Telephone Encounter (Signed)
Pt r/s to 4/20

## 2015-10-12 ENCOUNTER — Encounter: Payer: Self-pay | Admitting: Family Medicine

## 2015-10-12 ENCOUNTER — Telehealth: Payer: Self-pay | Admitting: Radiology

## 2015-10-12 ENCOUNTER — Ambulatory Visit (INDEPENDENT_AMBULATORY_CARE_PROVIDER_SITE_OTHER): Payer: BLUE CROSS/BLUE SHIELD | Admitting: Family Medicine

## 2015-10-12 ENCOUNTER — Encounter: Payer: Self-pay | Admitting: Emergency Medicine

## 2015-10-12 ENCOUNTER — Inpatient Hospital Stay
Admission: EM | Admit: 2015-10-12 | Discharge: 2015-10-13 | DRG: 812 | Disposition: A | Discharge status: Home / Self Care | Payer: BLUE CROSS/BLUE SHIELD | Attending: Internal Medicine | Admitting: Internal Medicine

## 2015-10-12 VITALS — BP 90/50 | HR 82 | Temp 97.9°F | Wt 119.5 lb

## 2015-10-12 DIAGNOSIS — D529 Folate deficiency anemia, unspecified: Secondary | ICD-10-CM | POA: Diagnosis not present

## 2015-10-12 DIAGNOSIS — R002 Palpitations: Secondary | ICD-10-CM | POA: Diagnosis not present

## 2015-10-12 DIAGNOSIS — N915 Oligomenorrhea, unspecified: Secondary | ICD-10-CM | POA: Diagnosis not present

## 2015-10-12 DIAGNOSIS — R000 Tachycardia, unspecified: Secondary | ICD-10-CM | POA: Diagnosis not present

## 2015-10-12 DIAGNOSIS — F17210 Nicotine dependence, cigarettes, uncomplicated: Secondary | ICD-10-CM | POA: Diagnosis present

## 2015-10-12 DIAGNOSIS — E039 Hypothyroidism, unspecified: Secondary | ICD-10-CM | POA: Diagnosis present

## 2015-10-12 DIAGNOSIS — K589 Irritable bowel syndrome without diarrhea: Secondary | ICD-10-CM | POA: Diagnosis present

## 2015-10-12 DIAGNOSIS — R9431 Abnormal electrocardiogram [ECG] [EKG]: Secondary | ICD-10-CM

## 2015-10-12 DIAGNOSIS — D649 Anemia, unspecified: Secondary | ICD-10-CM | POA: Diagnosis not present

## 2015-10-12 DIAGNOSIS — E876 Hypokalemia: Secondary | ICD-10-CM | POA: Diagnosis present

## 2015-10-12 DIAGNOSIS — Z720 Tobacco use: Secondary | ICD-10-CM | POA: Diagnosis not present

## 2015-10-12 DIAGNOSIS — D519 Vitamin B12 deficiency anemia, unspecified: Secondary | ICD-10-CM | POA: Diagnosis not present

## 2015-10-12 LAB — CBC WITH DIFFERENTIAL/PLATELET
BASOS ABS: 0 10*3/uL (ref 0–0.1)
BASOS PCT: 0.4 % (ref 0.0–3.0)
Basophils Absolute: 0 10*3/uL (ref 0.0–0.1)
Basophils Relative: 0 %
EOS ABS: 0.1 10*3/uL (ref 0.0–0.7)
Eosinophils Absolute: 0.1 10*3/uL (ref 0–0.7)
Eosinophils Relative: 1.2 % (ref 0.0–5.0)
HCT: 13.9 % — CL (ref 35.0–47.0)
HEMATOCRIT: 13.7 % — AB (ref 36.0–46.0)
Hemoglobin: 4.4 g/dL — CL (ref 12.0–15.0)
Hemoglobin: 4.6 g/dL — CL (ref 12.0–16.0)
LYMPHS ABS: 1.7 10*3/uL (ref 0.7–4.0)
LYMPHS ABS: 1.6 10*3/uL (ref 1.0–3.6)
LYMPHS PCT: 25.5 % (ref 12.0–46.0)
MCH: 43.3 pg — AB (ref 26.0–34.0)
MCHC: 32.2 g/dL (ref 30.0–36.0)
MCHC: 32.7 g/dL (ref 32.0–36.0)
MCV: 128.4 fl — AB (ref 78.0–100.0)
MCV: 132.5 fL — AB (ref 80.0–100.0)
MONO ABS: 0.3 10*3/uL (ref 0.2–0.9)
Monocytes Absolute: 0.3 10*3/uL (ref 0.1–1.0)
Monocytes Relative: 4.7 % (ref 3.0–12.0)
NEUTROS ABS: 4.5 10*3/uL (ref 1.4–7.7)
NEUTROS PCT: 68.2 % (ref 43.0–77.0)
Neutro Abs: 4 10*3/uL (ref 1.4–6.5)
Neutrophils Relative %: 68 %
PLATELETS: 189 10*3/uL (ref 150.0–400.0)
PLATELETS: 169 10*3/uL (ref 150–440)
RBC: 1.07 Mil/uL — ABNORMAL LOW (ref 3.87–5.11)
RBC: 1.05 MIL/uL — ABNORMAL LOW (ref 3.80–5.20)
RDW: 23.7 % — AB (ref 11.5–15.5)
RDW: 19.3 % — AB (ref 11.5–14.5)
WBC: 6.7 10*3/uL (ref 4.0–10.5)
WBC: 6 10*3/uL (ref 3.6–11.0)

## 2015-10-12 LAB — COMPREHENSIVE METABOLIC PANEL
ALT: 9 U/L (ref 0–35)
AST: 20 U/L (ref 0–37)
Albumin: 3.5 g/dL (ref 3.5–5.2)
Alkaline Phosphatase: 61 U/L (ref 39–117)
BUN: 8 mg/dL (ref 6–23)
CALCIUM: 8.9 mg/dL (ref 8.4–10.5)
CHLORIDE: 107 meq/L (ref 96–112)
CO2: 25 meq/L (ref 19–32)
Creatinine, Ser: 0.57 mg/dL (ref 0.40–1.20)
GFR: 126.69 mL/min (ref >60.00)
Glucose, Bld: 99 mg/dL (ref 70–99)
Potassium: 3.5 mEq/L (ref 3.5–5.1)
Sodium: 141 mEq/L (ref 135–145)
Total Bilirubin: 0.8 mg/dL (ref 0.2–1.2)
Total Protein: 5.7 g/dL — ABNORMAL LOW (ref 6.0–8.3)

## 2015-10-12 LAB — PREPARE RBC (CROSSMATCH)

## 2015-10-12 LAB — IRON AND TIBC
IRON: 278 ug/dL — AB (ref 28–170)
SATURATION RATIOS: 88 % — AB (ref 10.4–31.8)
TIBC: 316 ug/dL (ref 250–450)
UIBC: 38 ug/dL

## 2015-10-12 LAB — T4, FREE: Free T4: 0.75 ng/dL (ref 0.60–1.60)

## 2015-10-12 LAB — BASIC METABOLIC PANEL
Anion gap: 8 (ref 5–15)
BUN: 9 mg/dL (ref 6–20)
CHLORIDE: 109 mmol/L (ref 101–111)
CO2: 24 mmol/L (ref 22–32)
Calcium: 8.5 mg/dL — ABNORMAL LOW (ref 8.9–10.3)
Creatinine, Ser: 0.6 mg/dL (ref 0.44–1.00)
GFR calc Af Amer: >60 mL/min (ref >60)
GFR calc non Af Amer: >60 mL/min (ref >60)
Glucose, Bld: 106 mg/dL — ABNORMAL HIGH (ref 65–99)
Potassium: 3.2 mmol/L — ABNORMAL LOW (ref 3.5–5.1)
Sodium: 141 mmol/L (ref 135–145)

## 2015-10-12 LAB — URINALYSIS COMPLETE WITH MICROSCOPIC (ARMC ONLY)
Bacteria, UA: NONE SEEN
Bilirubin Urine: NEGATIVE
Glucose, UA: NEGATIVE mg/dL
HGB URINE DIPSTICK: NEGATIVE
KETONES UR: NEGATIVE mg/dL
Leukocytes, UA: NEGATIVE
NITRITE: NEGATIVE
PH: 6 (ref 5.0–8.0)
PROTEIN: NEGATIVE mg/dL
RBC / HPF: NONE SEEN RBC/hpf (ref 0–5)
SPECIFIC GRAVITY, URINE: 1.014 (ref 1.005–1.030)

## 2015-10-12 LAB — ABO/RH: ABO/RH(D): B POS

## 2015-10-12 LAB — FERRITIN: Ferritin: 239 ng/mL (ref 11–307)

## 2015-10-12 LAB — LACTATE DEHYDROGENASE: LDH: 492 U/L — ABNORMAL HIGH (ref 98–192)

## 2015-10-12 LAB — BRAIN NATRIURETIC PEPTIDE: Pro B Natriuretic peptide (BNP): 160 pg/mL — ABNORMAL HIGH (ref 0.0–100.0)

## 2015-10-12 LAB — TSH: TSH: 7.63 u[IU]/mL — ABNORMAL HIGH (ref 0.35–4.50)

## 2015-10-12 MED ORDER — NICOTINE 21 MG/24HR TD PT24
21.0000 mg | MEDICATED_PATCH | Freq: Every day | TRANSDERMAL | Status: DC
  Administered 2015-10-12: 21 mg via TRANSDERMAL
  Filled 2015-10-12: qty 1

## 2015-10-12 MED ORDER — SODIUM CHLORIDE 0.9% FLUSH
3.0000 mL | Freq: Two times a day (BID) | INTRAVENOUS | Status: DC
Start: 2015-10-12 — End: 2015-10-13
  Administered 2015-10-12: 3 mL via INTRAVENOUS

## 2015-10-12 MED ORDER — SODIUM CHLORIDE 0.9 % IV SOLN
10.0000 mL/h | Freq: Once | INTRAVENOUS | Status: AC
  Administered 2015-10-12: 10 mL/h via INTRAVENOUS

## 2015-10-12 MED ORDER — OXYCODONE HCL 5 MG PO TABS: 5.0000 mg | ORAL_TABLET | ORAL | Status: DC | PRN

## 2015-10-12 MED ORDER — ACETAMINOPHEN 325 MG PO TABS
650.0000 mg | ORAL_TABLET | Freq: Four times a day (QID) | ORAL | Status: DC | PRN
Start: 2015-10-12 — End: 2015-10-13

## 2015-10-12 MED ORDER — SODIUM CHLORIDE 0.9 % IV SOLN: INTRAVENOUS | Status: DC

## 2015-10-12 MED ORDER — ACETAMINOPHEN 650 MG RE SUPP: 650.0000 mg | Freq: Four times a day (QID) | RECTAL | Status: DC | PRN

## 2015-10-12 MED ORDER — DOCUSATE SODIUM 100 MG PO CAPS: 100.0000 mg | ORAL_CAPSULE | Freq: Two times a day (BID) | ORAL | Status: DC | PRN

## 2015-10-12 MED ORDER — POTASSIUM CHLORIDE 20 MEQ PO PACK
40.0000 meq | PACK | Freq: Once | ORAL | Status: AC
Start: 2015-10-12 — End: 2015-10-12
  Administered 2015-10-12: 40 meq via ORAL
  Filled 2015-10-12: qty 2

## 2015-10-12 MED ORDER — ONDANSETRON HCL 4 MG/2ML IJ SOLN: 4.0000 mg | Freq: Four times a day (QID) | INTRAMUSCULAR | Status: DC | PRN

## 2015-10-12 MED ORDER — ONDANSETRON HCL 4 MG PO TABS: 4.0000 mg | ORAL_TABLET | Freq: Four times a day (QID) | ORAL | Status: DC | PRN

## 2015-10-12 NOTE — Progress Notes (Signed)
Second blood transfusion verified with Hansel StarlingAdrienne RN, pre-transfusion VSS, and patient is A&Ox4. Nursing staff will continue to monitor. Lamonte RicherKara A Vivianna Piccini, RN

## 2015-10-12 NOTE — Progress Notes (Signed)
Patient resting comfortably in bed and no signs/symptoms of transfusion reaction noted. VS remain stable and patient A&Ox4. Nursing staff will continue to monitor. Lamonte RicherKara A Tesha Archambeau, RN

## 2015-10-12 NOTE — Telephone Encounter (Signed)
Nicole MatesWaynetta advised to call pt and ask her to go straight to the ER.

## 2015-10-12 NOTE — ED Notes (Signed)
Blood transfusion started, Pt educated prior to infusion by EDP and written consent obtained. Second nurse verified orders. Pt in NAD and VSS prior to infusion, This RN at bedside

## 2015-10-12 NOTE — ED Notes (Signed)
POCT UApreg negative

## 2015-10-12 NOTE — ED Notes (Signed)
Notified of critical HgB level, EDP made aware. Pt in NAD at this time, respirations even and unlabored

## 2015-10-12 NOTE — ED Notes (Signed)
Patient presents to the ED with swelling to feet and ankles and reports palpitations.  Patient saw her physician today and was called to come to the ED due to a hemoglobin of 4.

## 2015-10-12 NOTE — ED Notes (Signed)
Pt tolerating transfusion well, No distress noted

## 2015-10-12 NOTE — Progress Notes (Signed)
Subjective:   Patient ID: Nicole [name], female    DOB: 1981-01-21, 35 y.o.   MRN: [id number]  Nicole [name] is a pleasant 35 y.o. year old female who presents to clinic today with Tachycardia  on 10/12/2015  HPI:  Several weeks of intermittent palpitations, DOE, and LE edema.    Notices that when she even walks up the stairs, she can feel her heart racing.  Has had intermittent issues with palpitations years ago- wore a monitor, but never this symptomatic. Lab Results  Component Value Date   TSH 4.73* 11/02/2014    +intermittent LE edema that improved but never fully resolves once she wakes up in the mornings. Edema started about the same time as the above symptoms.  No nausea, vomiting or diaphoresis.  No CP.  Rest does seem to cause her palpitations to improve.  Denies any new rxs, supplements or caffeine.  Grandmother had MI.  She is a smoker. No other risk factors. Current Outpatient Prescriptions on File Prior to Visit  Medication Sig Dispense Refill  . loperamide (IMODIUM) 2 MG capsule 2 mg 45 minutes prior to meals for IBS symptoms 30 capsule 3   No current facility-administered medications on file prior to visit.    No Known Allergies  Past Medical History  Diagnosis Date  . Kidney stones   . Migraines   . UTI (urinary tract infection)     Past Surgical History  Procedure Laterality Date  . Wisdom tooth extraction      Family History  Problem Relation Age of Onset  . Hypertension Mother     Social History   Social History  . Marital Status: Married    Spouse Name: N/A  . Number of Children: N/A  . Years of Education: N/A   Occupational History  . Not on file.   Social History Main Topics  . Smoking status: Current Every Day Smoker  . Smokeless tobacco: Never Used  . Alcohol Use: Yes  . Drug Use: No  . Sexual Activity: Yes   Other Topics Concern  . Not on file   Social History Narrative   The PMH, PSH, Social History,  Family History, Medications, and allergies have been reviewed in [facility], and have been updated if relevant.   Review of Systems  Constitutional: Negative.   Respiratory: Negative for chest tightness.   Cardiovascular: Positive for palpitations and leg swelling. Negative for chest pain.  Musculoskeletal: Negative.   Skin: Negative.   Neurological: Negative.   Hematological: Negative.   Psychiatric/Behavioral: Negative.   All other systems reviewed and are negative.      Objective:    BP 90/50 mmHg  Pulse 82  Temp(Src) 97.9 F (36.6 C) (Oral)  Wt 119 lb 8 oz (54.205 kg)  SpO2 97%   Physical Exam  Constitutional: She is oriented to person, place, and time. She appears well-developed and well-nourished. No distress.  HENT:  Head: Normocephalic.  Eyes: Conjunctivae are normal.  Neck: Normal range of motion.  Cardiovascular: Tachycardia present.   Pulmonary/Chest: Effort normal.  Musculoskeletal: Normal range of motion.  1+ pedal edema, left greater than right  Neurological: She is alert and oriented to person, place, and time.  Skin: Skin is warm and dry. She is not diaphoretic.  Psychiatric: She has a normal mood and affect. Her behavior is normal. Judgment and thought content normal.  Nursing note and vitals reviewed.         Assessment & Plan:   Tachycardia -  Plan: EKG 12-Lead, Ambulatory referral to Cardiology  Palpitations - Plan: Ambulatory referral to Cardiology  Nonspecific abnormal electrocardiogram (ECG) (EKG) - Plan: Ambulatory referral to Cardiology No Follow-up on file.

## 2015-10-12 NOTE — H&P (Signed)
[facility] [place] [facility]   PATIENT NAME: Nicole [name]    MR#:  [id number]  DATE OF BIRTH:  04/29/1981  DATE OF ADMISSION:  10/12/2015  PRIMARY CARE PHYSICIAN: Nicole Mannanalia Aron, MD   REQUESTING/REFERRING PHYSICIAN: Silverio Layyao, MD   CHIEF COMPLAINT:  Palpitations  HISTORY OF PRESENT ILLNESS:  Nicole [name]  is a 35 y.o. female with a known history of irritable bowel syndrome and migraines in to see her primary care physician for several weeks of intermittent palpitations associated with dyspnea on exertion and lower extremity edema.. Blood work was done and Nicole [name] and recommended to go to the ED for blood transfusion. Patient came into the ED [name] found to be at 4.6. Initial troponin is negative. Patient denies any chest pain. Denies any hematemesis or melena. Admits taking naproxen almost every day for irritable bowel syndrome. Reporting oligomenorrhea with last menstrual period being scanty approximate 2 months ago. Blood transfusion was ordered in the ED which was initiated in the emergency department  PAST MEDICAL HISTORY:   Past Medical History  Diagnosis Date  . Kidney stones   . Migraines   . UTI (urinary tract infection)    irritable bowel syndrome  PAST SURGICAL HISTOIRY:   Past Surgical History  Procedure Laterality Date  . Wisdom tooth extraction      SOCIAL HISTORY:   Social History  Substance Use Topics  . Smoking status: Current Every Day Smoker -- 1.00 packs/day    Types: Cigarettes  . Smokeless tobacco: Never Used  . Alcohol Use: Yes     Comment: daily   Lives with husband FAMILY HISTORY:   Family History  Problem Relation Age of Onset  . Hypertension Mother    Grandmother with bladder cancer DRUG ALLERGIES:  No Known Allergies  REVIEW OF SYSTEMS:  CONSTITUTIONAL: No fever, fatigue or weakness.  EYES: No blurred or double vision.  EARS, NOSE, AND THROAT: No tinnitus or ear pain.   RESPIRATORY: No cough, shortness of breath, wheezing or hemoptysis.  CARDIOVASCULAR: No chest pain, orthopnea, edema. Reporting dyspnea on exertion and palpitations GASTROINTESTINAL: No nausea, vomiting, diarrhea or abdominal pain.  GENITOURINARY: No dysuria, hematuria.  ENDOCRINE: No polyuria, nocturia,  HEMATOLOGY: No anemia, easy bruising or bleeding SKIN: No rash or lesion. MUSCULOSKELETAL: No joint pain or arthritis.  Reporting lower extremity swelling NEUROLOGIC: No tingling, numbness, weakness.  PSYCHIATRY: No anxiety or depression.   MEDICATIONS AT HOME:   Prior to Admission medications   Not on File      VITAL SIGNS:  Blood pressure 105/48, pulse 98, temperature 98.4 F (36.9 C), temperature source Oral, resp. rate 18, height 5\' 11"  (1.803 m), weight 54.522 kg (120 lb 3.2 oz), SpO2 100 %.  PHYSICAL EXAMINATION:  GENERAL:  35 y.o.-year-old patient lying in the bed with no acute distress.  EYES: Pupils equal, round, reactive to light and accommodation. No scleral icterus. Extraocular muscles intact. Pale conjunctiva HEENT: Head atraumatic, normocephalic. Oropharynx and nasopharynx clear.  NECK:  Supple, no jugular venous distention. No thyroid enlargement, no tenderness.  LUNGS: Normal breath sounds bilaterally, no wheezing, rales,rhonchi or crepitation. No use of accessory muscles of respiration.  CARDIOVASCULAR: S1, S2 normal. Positive ejection systolic murmurs, no rubs, or gallops.  ABDOMEN: Soft, nontender, nondistended. Bowel sounds present. No organomegaly or mass.  EXTREMITIES: No pedal edema, cyanosis, or clubbing.  NEUROLOGIC: Cranial nerves II through XII are intact. Muscle strength 5/5 in all extremities. Sensation intact. Gait not  checked.  PSYCHIATRIC: The patient is alert and oriented x 3.  SKIN: No obvious rash, lesion, or ulcer.   LABORATORY PANEL:   CBC  Recent Labs Lab 10/12/15 1554  WBC 6.0  HGB 4.6*  HCT 13.9*  PLT 169    ------------------------------------------------------------------------------------------------------------------  Chemistries   Recent Labs Lab 10/12/15 1143 10/12/15 1554  NA 141 141  K 3.5 3.2*  CL 107 109  CO2 25 24  GLUCOSE 99 106*  BUN 8 9  CREATININE 0.57 0.60  CALCIUM 8.9 8.5*  AST 20  --   ALT 9  --   ALKPHOS 61  --   BILITOT 0.8  --    ------------------------------------------------------------------------------------------------------------------  Cardiac Enzymes No results for input(s): TROPONINI in the last 168 hours. ------------------------------------------------------------------------------------------------------------------  RADIOLOGY:  No results found.  EKG:   Orders placed or performed during the hospital encounter of 10/12/15  . ED EKG  . ED EKG  . EKG 12-Lead  . EKG 12-Lead    IMPRESSION AND PLAN:  Nicole [name]  is a 35 y.o. female with a known history of irritable bowel syndrome and migraines in to see her primary care physician for several weeks of intermittent palpitations associated with dyspnea on exertion and lower extremity edema.. Blood work was done and Nicole fundraiser called the patient with low [name] and recommended to go to the ED for blood transfusion. Patient came into the ED [name] found to be at 4.6. Initial troponin is negative. Patient denies any chest pain. Denies any hematemesis or melena  # Symptomatic macrocytic anemia with palpitations and dyspnea on exertion-could be from B12 or folate deficiency [name] at 4.6 Type and crossmatch and transfuse 3 units of blood which was ordered in the ED Monitor [name] and hematocrit closely Check iron function studies, B12 and folate, LDH, haptoglobin, TSH and peripheral blood smear Consult gastroenterology ppi Check stool for Hemoccult  #Hypokalemia replete and check potassium and magnesium levels in a.m.  #Irritable bowel syndrome currently asymptomatic. Will give  Bentyl as needed Avoid NSAIDs  #Palpitations probably from symptomatic anemia Will check TSH and monitor patient on telemetry.  #Oligomenorrhea most likely from symptomatic anemia Outpatient follow-up with gynecology has recommended an patient's last Pap smear was normal as reported by the patient  #Tobacco abuse Consultation to quit smoking for 3-5 minutes. We will provide nicotine patch from a.m. Patient is agreeable.   Greater than 50% time was spent on face-to-face education, counseling and coordination of care  All the records are reviewed and case discussed with ED provider. Management plans discussed with the patient, family and they are in agreement.  CODE STATUS: fc, husband HCPOA  TOTAL TIME TAKING CARE OF THIS PATIENT: 45  minutes.    Ramonita Lab M.D on 10/12/2015 at 7:08 PM  Between 7am to 6pm - Pager - [phone]  After 6pm go to [email or web address] - password [email or web address] [facility]  [facility]  Office  [phone]  CC: Primary care physician; Nicole Mannan, MD

## 2015-10-12 NOTE — Progress Notes (Signed)
Patient arrived to 2A Room 239. Patient denies pain and all questions answered. Patient oriented to unit and Fall Safety Plan signed. Skin assessment completed with Maddie RN and skin intact. A&Ox4, VSS, first blood transfusion infusing, and NSR on tele box #[id number]. Family at bedside. Nursing staff will continue to monitor. Lamonte RicherKara A Judithe Keetch, RN

## 2015-10-12 NOTE — Assessment & Plan Note (Signed)
With edema, DOE and abnormal EKG- less concerning for ACS but I am concerned of signs of heart strain. Stat referral to cardiology. Labs today to rule out other factors, such as thyroid dysfunction. Start ASA 81 mg daily. The patient indicates understanding of these issues and agrees with the plan. Orders Placed This Encounter  Procedures  . TSH  . T4, Free  . CBC with Differential/Platelet  . Comprehensive metabolic panel  . Brain natriuretic peptide  . Ambulatory referral to Cardiology  . EKG 12-Lead

## 2015-10-12 NOTE — ED Provider Notes (Addendum)
CSN: [id number]     Arrival date & time 10/12/15  1545 History   First MD Initiated Contact with Patient 10/12/15 1555     Chief Complaint  Patient presents with  . Weakness  . Palpitations  . Leg Swelling     (Consider location/radiation/quality/duration/timing/severity/associated sxs/prior Treatment) The history is provided by the patient.  Nicole [name] is a 35 y.o. female who presenting with palpitations, leg swelling, anemia. Patient states that she's been having palpitations for 4-5 weeks. She also has broke progressive leg swelling as well. She has shortness of breath with exertion as well. She went to see her primary care doctor will have blood drawn and noticed that she had a hemoglobin of 4 so sent her here for evaluation. She states that she has very irregular. He had's and last period but 2 months ago but he has some spotting and not unusual heavy. Denies any melena or vomiting or coughing.    Past Medical History  Diagnosis Date  . Kidney stones   . Migraines   . UTI (urinary tract infection)    Past Surgical History  Procedure Laterality Date  . Wisdom tooth extraction     Family History  Problem Relation Age of Onset  . Hypertension Mother    Social History  Substance Use Topics  . Smoking status: Current Every Day Smoker -- 1.00 packs/day    Types: Cigarettes  . Smokeless tobacco: Never Used  . Alcohol Use: Yes     Comment: daily   OB History    No data available     Review of Systems  Cardiovascular: Positive for palpitations.  Neurological: Positive for weakness.  All other systems reviewed and are negative.     Allergies  Review of patient's allergies indicates no known allergies.  Home Medications   Prior to Admission medications   Medication Sig Start Date End Date Taking? Authorizing Provider  loperamide (IMODIUM) 2 MG capsule 2 mg 45 minutes prior to meals for IBS symptoms 11/02/14   Dianne Dunalia M Aron, MD   BP 109/64 mmHg  Pulse 81   Temp(Src) 98.7 F (37.1 C) (Oral)  Resp 17  Ht 5\' 11"  (1.803 m)  Wt 119 lb (53.978 kg)  BMI 16.60 kg/m2  SpO2 92% Physical Exam  Constitutional: She is oriented to person, place, and time.  Pale   HENT:  Head: Normocephalic.  Mouth/Throat: Oropharynx is clear and moist.  Eyes: EOM are normal. Pupils are equal, round, and reactive to light.  Conjunctiva pale   Neck: Normal range of motion. Neck supple.  Cardiovascular: Regular rhythm and normal heart sounds.   Borderline tachy   Pulmonary/Chest: Effort normal and breath sounds normal. No respiratory distress. She has no wheezes. She has no rales.  Abdominal: Soft. Bowel sounds are normal. She exhibits no distension. There is no tenderness. There is no rebound.  Musculoskeletal: Normal range of motion. She exhibits no edema or tenderness.  Neurological: She is alert and oriented to person, place, and time. No cranial nerve deficit. Coordination normal.  Skin: Skin is warm and dry.  Psychiatric: She has a normal mood and affect. Her behavior is normal. Judgment and thought content normal.  Nursing note and vitals reviewed.   ED Course  Procedures (including critical care time)  CRITICAL CARE Performed by: Silverio LayYAO, Forbes Loll   Total critical care time: 30 minutes  Critical care time was exclusive of separately billable procedures and treating other patients.  Critical care was necessary to treat or  prevent imminent or life-threatening deterioration.  Critical care was time spent personally by me on the following activities: development of treatment plan with patient and/or surrogate as well as nursing, discussions with consultants, evaluation of patient's response to treatment, examination of patient, obtaining history from patient or surrogate, ordering and performing treatments and interventions, ordering and review of laboratory studies, ordering and review of radiographic studies, pulse oximetry and re-evaluation of patient's  condition.   Labs Review Labs Reviewed  URINALYSIS COMPLETEWITH MICROSCOPIC ([facility] ONLY)  CBC WITH DIFFERENTIAL/PLATELET  BASIC METABOLIC PANEL  TROPONIN I  IRON AND TIBC  LACTATE DEHYDROGENASE  FERRITIN  TYPE AND SCREEN  ABO/RH  PREPARE RBC (CROSSMATCH)    Imaging Review No results found. I have personally reviewed and evaluated these images and lab results as part of my medical decision-making.   EKG Interpretation None      ED ECG REPORT I, Loyce Klasen, the attending physician, personally viewed and interpreted this ECG.   Date: 10/16/2015  EKG Time: 16:04  Rate: 98  Rhythm: normal EKG, normal sinus rhythm  Axis: right  Intervals:none  ST&T Change: nonspecific    MDM   Final diagnoses:  None   Nicole LEVITAN is a 35 y.o. female here with palpitations, shortness of breath, anemia. Likely symptomatic anemia. Denies melena or heavy menses. Will repeat Hg and if it is still around 4, will transfuse and admit for anemia workup. I counseled her regarding the risks of transfusion.   4:48 PM Hg confirmed to 4.6. I ordered 3 U PRBC. Ordered 3 U PRBC transfusion. Ordered anemia panel.     Richardean Canal, MD 10/12/15 1651  Richardean Canal, MD 10/16/15 1009

## 2015-10-12 NOTE — Telephone Encounter (Signed)
[facility] called a critical HGB, 4.4, HCT 13.7. Results called to Dr Dayton MartesAron

## 2015-10-12 NOTE — Patient Instructions (Signed)
Good to see you.  Please start taking a baby aspirin 81 mg daily.  Stop by to see Shirlee LimerickMarion on your way out.

## 2015-10-12 NOTE — Progress Notes (Signed)
Pre visit review using our clinic review tool, if applicable. No additional management support is needed unless otherwise documented below in the visit note. 

## 2015-10-12 NOTE — Progress Notes (Signed)
Blood transfusion complete at 2025. No signs or symptoms of reaction noted, VSS, and patient tolerated well. Nursing staff will continue to monitor. Lamonte RicherKara A Mujahid Jalomo, RN

## 2015-10-12 NOTE — ED Notes (Signed)
MD at bedside. 

## 2015-10-13 ENCOUNTER — Ambulatory Visit: Payer: BLUE CROSS/BLUE SHIELD | Admitting: Cardiovascular Disease

## 2015-10-13 ENCOUNTER — Encounter: Payer: Self-pay | Admitting: *Deleted

## 2015-10-13 LAB — COMPREHENSIVE METABOLIC PANEL
ALT: 16 U/L (ref 14–54)
AST: 80 U/L — AB (ref 15–41)
Albumin: 3.3 g/dL — ABNORMAL LOW (ref 3.5–5.0)
Alkaline Phosphatase: 69 U/L (ref 38–126)
Anion gap: 3 — ABNORMAL LOW (ref 5–15)
BILIRUBIN TOTAL: 1.3 mg/dL — AB (ref 0.3–1.2)
BUN: 11 mg/dL (ref 6–20)
CHLORIDE: 112 mmol/L — AB (ref 101–111)
CO2: 26 mmol/L (ref 22–32)
CREATININE: 0.61 mg/dL (ref 0.44–1.00)
Calcium: 8.4 mg/dL — ABNORMAL LOW (ref 8.9–10.3)
Glucose, Bld: 105 mg/dL — ABNORMAL HIGH (ref 65–99)
POTASSIUM: 3.7 mmol/L (ref 3.5–5.1)
Sodium: 141 mmol/L (ref 135–145)
TOTAL PROTEIN: 5.6 g/dL — AB (ref 6.5–8.1)

## 2015-10-13 LAB — CBC
HEMATOCRIT: 26 % — AB (ref 35.0–47.0)
Hemoglobin: 9 g/dL — ABNORMAL LOW (ref 12.0–16.0)
MCH: 34.5 pg — ABNORMAL HIGH (ref 26.0–34.0)
MCHC: 34.7 g/dL (ref 32.0–36.0)
MCV: 99.3 fL (ref 80.0–100.0)
PLATELETS: 148 10*3/uL — AB (ref 150–440)
RBC: 2.62 MIL/uL — AB (ref 3.80–5.20)
RDW: 30.9 % — AB (ref 11.5–14.5)
WBC: 6.9 10*3/uL (ref 3.6–11.0)

## 2015-10-13 LAB — TROPONIN I

## 2015-10-13 LAB — TSH: TSH: 11.391 u[IU]/mL — AB (ref 0.350–4.500)

## 2015-10-13 LAB — HEMOGLOBIN AND HEMATOCRIT, BLOOD
HCT: 26.7 % — ABNORMAL LOW (ref 35.0–47.0)
Hemoglobin: 9.2 g/dL — ABNORMAL LOW (ref 12.0–16.0)

## 2015-10-13 LAB — HCG, QUANTITATIVE, PREGNANCY: hCG, Beta Chain, Quant, S: <1 m[IU]/mL (ref <5)

## 2015-10-13 LAB — FOLATE: FOLATE: 3.5 ng/mL — AB (ref >5.9)

## 2015-10-13 LAB — HEMOGLOBIN A1C: Hgb A1c MFr Bld: 5.4 % (ref 4.0–6.0)

## 2015-10-13 LAB — VITAMIN B12: VITAMIN B 12: 97 pg/mL — AB (ref 180–914)

## 2015-10-13 LAB — OCCULT BLOOD X 1 CARD TO LAB, STOOL: FECAL OCCULT BLD: NEGATIVE

## 2015-10-13 MED ORDER — LEVOTHYROXINE SODIUM 25 MCG PO TABS: 25.0000 ug | ORAL_TABLET | Freq: Every day | ORAL | Status: DC

## 2015-10-13 MED ORDER — OMEPRAZOLE 40 MG PO CPDR: 40.0000 mg | DELAYED_RELEASE_CAPSULE | Freq: Every day | ORAL | Status: DC

## 2015-10-13 MED ORDER — ADULT MULTIVITAMIN W/MINERALS CH: 1.0000 | ORAL_TABLET | Freq: Every day | ORAL | Status: DC

## 2015-10-13 MED ORDER — FOLIC ACID 1 MG PO TABS: 1.0000 mg | ORAL_TABLET | Freq: Every day | ORAL | Status: DC

## 2015-10-13 MED ORDER — LEVOTHYROXINE SODIUM 50 MCG PO TABS
25.0000 ug | ORAL_TABLET | Freq: Every day | ORAL | Status: DC
  Administered 2015-10-13: 25 ug via ORAL
  Filled 2015-10-13: qty 1

## 2015-10-13 MED ORDER — ADULT MULTIVITAMIN W/MINERALS CH: 1.0000 | ORAL_TABLET | Freq: Every day | ORAL | Status: AC

## 2015-10-13 NOTE — Progress Notes (Signed)
Second transfusion completed at 0030, VSS, patient tolerated well. Nursing staff will continue to monitor. Lamonte RicherKara A Renay Crammer, RN

## 2015-10-13 NOTE — Progress Notes (Signed)
Patient given discharge teaching and paperwork regarding medications, diet, follow-up appointments and activity. Patient understanding verbalized. No complaints at this time. IV and telemetry discontinued prior to leaving. Skin assessment as previously charted and vitals are stable; on room air. Patient being discharged to home. Caregiver/family present during discharge teaching. No further needs by Care Management. [facility]

## 2015-10-13 NOTE — Discharge Summary (Signed)
[facility] [place] [facility]   PATIENT NAME: Nicole [name]    MR#:  [id number]  DATE OF BIRTH:  1980/12/15  DATE OF ADMISSION:  10/12/2015 ADMITTING PHYSICIAN: Ramonita LabAruna Gouru, MD  DATE OF DISCHARGE: 10/13/15  PRIMARY CARE PHYSICIAN: Ruthe Mannanalia Aron, MD    ADMISSION DIAGNOSIS:  Anemia, unspecified anemia type [D64.9]  DISCHARGE DIAGNOSIS:  Anemia-w/u as outpt since pt wants to leave Tobacco abuse Hypothyroidism-new SECONDARY DIAGNOSIS:   Past Medical History  Diagnosis Date  . Kidney stones   . Migraines   . UTI (urinary tract infection)     HOSPITAL COURSE:   Nicole [name] is a 35 y.o. female with a known history of irritable bowel syndrome and migraines in to see her primary care physician for several weeks of intermittent palpitations associated with dyspnea on exertion and lower extremity edema.. Blood work was done and [name] called the patient with low hemoglobin and recommended to go to the ED for blood transfusion. Patient came into the ED hemoglobin found to be at 4.6. Initial troponin is negative. Patient denies any chest pain. Denies any hematemesis or melena  # Symptomatic macrocytic anemia with palpitations and dyspnea on exertion-could be from B12 or folate deficiency Hemoglobin at 4.6---3 units BT---9.2 Reviewed iron function studies, B12 and folate, LDH, haptoglobinConsult gastroenterologySpoke with Dr. Servando Snarewohl ppi for now Check stool for Hemoccult Denies menorrhagia. Pt wants GI and hematology w/u as out pt  #Folate deficiency Start folic acid 1 mg by mouth daily. B12 pending.  #Hypothyroidism new-onset Started on Synthroid  #Hypokalemia replete   #Irritable bowel syndrome currently asymptomatic. Will give Bentyl as needed Avoid NSAIDs  #Palpitations probably from symptomatic anemia Patient currently sinus rhythm on telemetry. Discontinue telemetry  #Tobacco abuse Consultation to quit smoking for 3-5 minutes. Patient is  agreeable.  Since pt does not want to stay in the hospital and not acitively bleeding will d/c her with outpt f/u  CONSULTS OBTAINED:  Treatment Team:  Midge Miniumarren Wohl, MD Earna CoderGovinda R Brahmanday, MD  DRUG ALLERGIES:  No Known Allergies  DISCHARGE MEDICATIONS:   Current Discharge Medication List    START taking these medications   Details  folic acid (FOLVITE) 1 MG tablet Take 1 tablet (1 mg total) by mouth daily. Qty: 30 tablet, Refills: 0    levothyroxine (SYNTHROID, LEVOTHROID) 25 MCG tablet Take 1 tablet (25 mcg total) by mouth daily before breakfast. Qty: 30 tablet, Refills: 2    Multiple Vitamin (MULTIVITAMIN WITH MINERALS) TABS tablet Take 1 tablet by mouth daily. Qty: 30 tablet, Refills: 0    omeprazole (PRILOSEC) 40 MG capsule Take 1 capsule (40 mg total) by mouth daily. Qty: 30 capsule, Refills: 2       If you experience worsening of your admission symptoms, develop shortness of breath, life threatening emergency, suicidal or homicidal thoughts you must seek medical attention immediately by calling 911 or calling your MD immediately  if symptoms less severe.  You Must read complete instructions/literature along with all the possible adverse reactions/side effects for all the Medicines you take and that have been prescribed to you. Take any new Medicines after you have completely understood and accept all the possible adverse reactions/side effects.   Please note  You were cared for by a hospitalist during your hospital stay. If you have any questions about your discharge medications or the care you received while you were in the hospital after you are discharged, you can call the unit and asked to speak with  the hospitalist on call if the hospitalist that took care of you is not available. Once you are discharged, your primary care physician will handle any further medical issues. Please note that NO REFILLS for any discharge medications will be authorized once you are  discharged, as it is imperative that you return to your primary care physician (or establish a relationship with a primary care physician if you do not have one) for your aftercare needs so that they can reassess your need for medications and monitor your lab values.  DATA REVIEW:   CBC   Recent Labs Lab 10/12/15 1554 10/13/15 0417  WBC 6.0  --   HGB 4.6* 9.2*  HCT 13.9* 26.7*  PLT 169  --     Chemistries   Recent Labs Lab 10/13/15 0417  NA 141  K 3.7  CL 112*  CO2 26  GLUCOSE 105*  BUN 11  CREATININE 0.61  CALCIUM 8.4*  AST 80*  ALT 16  ALKPHOS 69  BILITOT 1.3*    Microbiology Results   No results found for this or any previous visit (from the past 240 hour(s)).  RADIOLOGY:  No results found.   Management plans discussed with the patient, family and they are in agreement.  CODE STATUS:     Code Status Orders        Start     Ordered   10/12/15 1859  Full code   Continuous     10/12/15 1859    Code Status History    Date Active Date Inactive Code Status Order ID Comments User Context   This patient has a current code status but no historical code status.      TOTAL TIME TAKING CARE OF THIS PATIENT: 40 minutes.    Fabrice Dyal M.D on 10/13/2015 at 10:26 AM  Between 7am to 6pm - Pager - [phone] After 6pm go to [email or web address] - password [email or web address] [facility]  [place] [facility]  Office  [phone]  CC: Primary care physician; Ruthe Mannan, MD

## 2015-10-13 NOTE — Progress Notes (Signed)
Patient ID: Nicole [name], female   DOB: 1980/08/31, 35 y.o.   MRN: [id number] [facility] [place] [facility]   PATIENT NAME: Nicole [name]    MR#:  [id number]  DATE OF BIRTH:  1980/08/31  SUBJECTIVE:  Came in with palpitations on and off fatigue and leg edema. Reports taking Aleve on a daily basis for her irritable bowel syndrome. Found to have hemoglobin of 4.4. Status post 3 unit transfusion. Denies any complaints today. Denies heavy menstrual cycles.  REVIEW OF SYSTEMS:   Review of Systems  Constitutional: Negative for fever, chills and weight loss.  HENT: Negative for ear discharge, ear pain and nosebleeds.   Eyes: Negative for blurred vision, pain and discharge.  Respiratory: Negative for sputum production, shortness of breath, wheezing and stridor.   Cardiovascular: Positive for palpitations and leg swelling. Negative for chest pain, orthopnea and PND.  Gastrointestinal: Negative for nausea, vomiting, abdominal pain and diarrhea.  Genitourinary: Negative for urgency and frequency.  Musculoskeletal: Negative for back pain and joint pain.  Neurological: Negative for sensory change, speech change, focal weakness and weakness.  Psychiatric/Behavioral: Negative for depression and hallucinations. The patient is not nervous/anxious.   All other systems reviewed and are negative.  Tolerating Diet: Yes Tolerating PT: Not needed  DRUG ALLERGIES:  No Known Allergies  VITALS:  Blood pressure 110/67, pulse 75, temperature 98.3 F (36.8 C), temperature source Oral, resp. rate 16, height 5\' 11"  (1.803 m), weight 54.522 kg (120 lb 3.2 oz), SpO2 99 %.  PHYSICAL EXAMINATION:   Physical Exam  GENERAL:  35 y.o.-year-old patient lying in the bed with no acute distress.  EYES: Pupils equal, round, reactive to light and accommodation. No scleral icterus. Extraocular muscles intact.  HEENT: Head atraumatic, normocephalic. Oropharynx and nasopharynx clear.   NECK:  Supple, no jugular venous distention. No thyroid enlargement, no tenderness.  LUNGS: Normal breath sounds bilaterally, no wheezing, rales, rhonchi. No use of accessory muscles of respiration.  CARDIOVASCULAR: S1, S2 normal. No murmurs, rubs, or gallops.  ABDOMEN: Soft, nontender, nondistended. Bowel sounds present. No organomegaly or mass.  EXTREMITIES: No cyanosis, clubbing or edema b/l.    NEUROLOGIC: Cranial nerves II through XII are intact. No focal Motor or sensory deficits b/l.   PSYCHIATRIC:  patient is alert and oriented x 3.  SKIN: No obvious rash, lesion, or ulcer.   LABORATORY PANEL:  CBC  Recent Labs Lab 10/12/15 1554 10/13/15 0417  WBC 6.0  --   HGB 4.6* 9.2*  HCT 13.9* 26.7*  PLT 169  --     Chemistries   Recent Labs Lab 10/13/15 0417  NA 141  K 3.7  CL 112*  CO2 26  GLUCOSE 105*  BUN 11  CREATININE 0.61  CALCIUM 8.4*  AST 80*  ALT 16  ALKPHOS 69  BILITOT 1.3*   Cardiac Enzymes  Recent Labs Lab 10/13/15 0417  TROPONINI <0.03   RADIOLOGY:  No results found. ASSESSMENT AND PLAN:  Nicole [name] is a 35 y.o. female with a known history of irritable bowel syndrome and migraines in to see her primary care physician for several weeks of intermittent palpitations associated with dyspnea on exertion and lower extremity edema.. Blood work was done and [name] called the patient with low hemoglobin and recommended to go to the ED for blood transfusion. Patient came into the ED hemoglobin found to be at 4.6. Initial troponin is negative. Patient denies any chest pain. Denies any hematemesis or melena  # Symptomatic  macrocytic anemia with palpitations and dyspnea on exertion-could be from B12 or folate deficiency Hemoglobin at 4.6---3 units BT---9.2 Check iron function studies, B12 and folate, LDH, haptoglobin, TSH and peripheral blood smear Consult gastroenterologySpoke with Dr. Servando Snare ppi Check stool for Hemoccult Denies menorrhagia. Will do  hematology consultation for further workup for anemia  #Folate deficiency Start folic acid 1 mg by mouth daily. B12 pending.  #Hypothyroidism new-onset Started on Synthroid  #Hypokalemia replete   #Irritable bowel syndrome currently asymptomatic. Will give Bentyl as needed Avoid NSAIDs  #Palpitations probably from symptomatic anemia Patient currently sinus rhythm on telemetry. Discontinue telemetry  #Tobacco abuse Consultation to quit smoking for 3-5 minutes.  Patient is agreeable.   Case discussed with Care Management/Social Worker. Management plans discussed with the patient, family and they are in agreement.  CODE STATUS: Full  DVT Prophylaxis: SCD teds  TOTAL TIME TAKING CARE OF THIS PATIENT: 30 minutes.  >50% time spent on counselling and coordination of care patient and GI    Note: This dictation was prepared with Dragon dictation along with smaller phrase technology. Any transcriptional errors that result from this process are unintentional.  Jaedan Schuman M.D on 10/13/2015 at 7:50 AM  Between 7am to 6pm - Pager - [phone]  After 6pm go to [email or web address] - password [email or web address] [facility]  [place] [facility]  Office  [phone]  CC: Primary care physician; Ruthe Mannan, MD

## 2015-10-13 NOTE — Progress Notes (Signed)
Patient ID: Nicole [name], female   DOB: [birth year], 35 y.o.   MRN: [id number] Called by Rn that pt does not want to sit all day waiting [facility]. She is craving for cigarettes and wants to leave. She is agreeable to see above consultants as outpt. She is not actively bleeding at present. Since she is not willing to stay i will d/c her with outpt f/u

## 2015-10-13 NOTE — Progress Notes (Signed)
Third transfusion completed at 0342 with no complications and VSS. Nursing staff will continue to monitor. Lamonte RicherKara A Andjela Wickes, RN

## 2015-10-14 LAB — HAPTOGLOBIN: HAPTOGLOBIN: 14 mg/dL — AB (ref 34–200)

## 2015-10-15 LAB — TYPE AND SCREEN
ABO/RH(D): B POS
Antibody Screen: NEGATIVE
UNIT DIVISION: 0
UNIT DIVISION: 0
Unit division: 0
Unit division: 0
Unit division: 0

## 2015-10-15 LAB — H. PYLORI ANTIGEN, STOOL: H. PYLORI STOOL AG, EIA: NEGATIVE

## 2015-10-18 ENCOUNTER — Ambulatory Visit: Payer: BLUE CROSS/BLUE SHIELD | Admitting: Family Medicine

## 2015-10-19 ENCOUNTER — Inpatient Hospital Stay: Payer: BLUE CROSS/BLUE SHIELD | Attending: Internal Medicine | Admitting: Internal Medicine

## 2015-10-19 ENCOUNTER — Encounter: Payer: Self-pay | Admitting: Internal Medicine

## 2015-10-19 ENCOUNTER — Inpatient Hospital Stay: Payer: BLUE CROSS/BLUE SHIELD

## 2015-10-19 VITALS — BP 116/68 | HR 73 | Temp 97.0°F | Resp 18 | Wt 117.9 lb

## 2015-10-19 DIAGNOSIS — Z79899 Other long term (current) drug therapy: Secondary | ICD-10-CM

## 2015-10-19 DIAGNOSIS — Z87442 Personal history of urinary calculi: Secondary | ICD-10-CM | POA: Diagnosis not present

## 2015-10-19 DIAGNOSIS — M7989 Other specified soft tissue disorders: Secondary | ICD-10-CM | POA: Diagnosis not present

## 2015-10-19 DIAGNOSIS — R221 Localized swelling, mass and lump, neck: Secondary | ICD-10-CM

## 2015-10-19 DIAGNOSIS — E538 Deficiency of other specified B group vitamins: Secondary | ICD-10-CM | POA: Insufficient documentation

## 2015-10-19 DIAGNOSIS — F17210 Nicotine dependence, cigarettes, uncomplicated: Secondary | ICD-10-CM

## 2015-10-19 DIAGNOSIS — K589 Irritable bowel syndrome without diarrhea: Secondary | ICD-10-CM | POA: Insufficient documentation

## 2015-10-19 DIAGNOSIS — D649 Anemia, unspecified: Secondary | ICD-10-CM | POA: Diagnosis not present

## 2015-10-19 DIAGNOSIS — Z8669 Personal history of other diseases of the nervous system and sense organs: Secondary | ICD-10-CM | POA: Diagnosis not present

## 2015-10-19 DIAGNOSIS — R0602 Shortness of breath: Secondary | ICD-10-CM | POA: Insufficient documentation

## 2015-10-19 LAB — CBC WITH DIFFERENTIAL/PLATELET
BASOS ABS: 0 10*3/uL (ref 0–0.1)
Basophils Relative: 1 %
EOS PCT: 4 %
Eosinophils Absolute: 0.2 10*3/uL (ref 0–0.7)
HEMATOCRIT: 32.8 % — AB (ref 35.0–47.0)
HEMOGLOBIN: 10.9 g/dL — AB (ref 12.0–16.0)
LYMPHS PCT: 34 %
Lymphs Abs: 1.9 10*3/uL (ref 1.0–3.6)
MCH: 34.4 pg — ABNORMAL HIGH (ref 26.0–34.0)
MCHC: 33.3 g/dL (ref 32.0–36.0)
MCV: 103.4 fL — AB (ref 80.0–100.0)
Monocytes Absolute: 0.5 10*3/uL (ref 0.2–0.9)
Monocytes Relative: 9 %
NEUTROS ABS: 3 10*3/uL (ref 1.4–6.5)
NEUTROS PCT: 54 %
PLATELETS: 254 10*3/uL (ref 150–440)
RBC: 3.17 MIL/uL — AB (ref 3.80–5.20)
RDW: 29.5 % — ABNORMAL HIGH (ref 11.5–14.5)
WBC: 5.6 10*3/uL (ref 3.6–11.0)

## 2015-10-19 LAB — LACTATE DEHYDROGENASE: LDH: 268 U/L — ABNORMAL HIGH (ref 98–192)

## 2015-10-19 MED ORDER — "SYRINGE 23G X 1"" 3 ML MISC": 1.0000 | Freq: Every day | Status: DC

## 2015-10-19 MED ORDER — CYANOCOBALAMIN 1000 MCG/ML IJ SOLN
1000.0000 ug | Freq: Once | INTRAMUSCULAR | Status: AC
  Administered 2015-10-19: 1000 ug via INTRAMUSCULAR
  Filled 2015-10-19: qty 1

## 2015-10-19 MED ORDER — CYANOCOBALAMIN 1000 MCG/ML IJ SOLN: 1000.0000 ug | Freq: Once | INTRAMUSCULAR | Status: DC

## 2015-10-19 NOTE — Progress Notes (Signed)
[facility] CONSULT NOTE  Patient Care Team: Dianne Dunalia M Aron, MD as PCP - General (Family Medicine)  CHIEF COMPLAINTS/PURPOSE OF CONSULTATION:   #  April 2017- ANEMIA MACROCYTIC [sec to B 12 deficiency/folate] Hb 4.6 s/p Transfusion.   #  Alcohol/ smoking  HISTORY OF PRESENTING ILLNESS:  Nicole [name] 35 y.o.  female prior history of irritable bowel syndrome and migraines was recently admitted to the hospital for severe anemia hemoglobin 4.6 at the PCPs office. Patient received 3 units of blood hemoglobin- hemoglobin up to 9.2.  Workup in hospital showed- low B12/folic acid. Starting folic acid. Plan for GI evaluation as an outpatient. Reviewed the hospital course/discharge summary. Patient is here in the hematology clinic for further recommendations and workup.  Patient complains of shortness of breath with exertion. Complains of swelling in the legs. She denies the tingling and numbness. No significant weight loss. No blood in stools black stools. No blood in urine. No history of any vaginal bleeding.   ROS: A complete 10 point review of system is done which is negative except mentioned above in history of present illness  MEDICAL HISTORY:  Past Medical History  Diagnosis Date  . Kidney stones   . Migraines   . UTI (urinary tract infection)   . Anemia     SURGICAL HISTORY: Past Surgical History  Procedure Laterality Date  . Wisdom tooth extraction      SOCIAL HISTORY: works Social research officer, [employer]; [place]; 1pack/day; liqor every day.  Social History   Social History  . Marital Status: Married    Spouse Name: N/A  . Number of Children: N/A  . Years of Education: N/A   Occupational History  . Not on file.   Social History Main Topics  . Smoking status: Current Every Day Smoker -- 1.00 packs/day    Types: Cigarettes  . Smokeless tobacco: Never Used  . Alcohol Use: 0.0 oz/week    0 Standard drinks or equivalent per week     Comment: daily liquor  . Drug Use: No   . Sexual Activity: Yes   Other Topics Concern  . Not on file   Social History Narrative    FAMILY HISTORY: grandma bladder cancer.  Family History  Problem Relation Age of Onset  . Hypertension Mother     ALLERGIES:  has No Known Allergies.  MEDICATIONS:  Current Outpatient Prescriptions  Medication Sig Dispense Refill  . folic acid (FOLVITE) 1 MG tablet Take 1 tablet (1 mg total) by mouth daily. 30 tablet 0  . levothyroxine (SYNTHROID, LEVOTHROID) 25 MCG tablet Take 1 tablet (25 mcg total) by mouth daily before breakfast. 30 tablet 2  . Multiple Vitamin (MULTIVITAMIN WITH MINERALS) TABS tablet Take 1 tablet by mouth daily. 30 tablet 0  . omeprazole (PRILOSEC) 40 MG capsule Take 1 capsule (40 mg total) by mouth daily. 30 capsule 2   No current facility-administered medications for this visit.      Marland Kitchen.  PHYSICAL EXAMINATION:   Filed Vitals:   10/19/15 0940  BP: 116/68  Pulse: 73  Temp: 97 F (36.1 C)   Filed Weights   10/19/15 0940  Weight: 117 lb 15.1 oz (53.5 kg)    GENERAL: Thin built moderately nourished Caucasian female patient; Alert, no distress and comfortable.   Accompanied by family EYES: Positive for pallor OROPHARYNX: no thrush or ulceration;  NECK: supple, no masses felt LYMPH:  no palpable lymphadenopathy in the cervical, axillary or inguinal regions LUNGS: clear to auscultation and  No wheeze or crackles HEART/CVS: regular rate & rhythm and no murmurs; No lower extremity edema ABDOMEN: abdomen soft, non-tender and normal bowel sounds Musculoskeletal:no cyanosis of digits and no clubbing  PSYCH: alert & oriented x 3 with fluent speech NEURO: no focal motor/sensory deficits SKIN:  no rashes or significant lesions  LABORATORY DATA:  I have reviewed the data as listed Lab Results  Component Value Date   WBC 6.9 10/13/2015   HGB 9.0* 10/13/2015   HGB 9.2* 10/13/2015   HCT 26.0* 10/13/2015   HCT 26.7* 10/13/2015   MCV 99.3 10/13/2015   PLT  148* 10/13/2015    Recent Labs  11/02/14 1140 10/12/15 1143 10/12/15 1554 10/13/15 0417  NA 140 141 141 141  K 3.9 3.5 3.2* 3.7  CL 105 107 109 112*  CO2 GLUCOSE 83 99 106* 105*  BUN CREATININE 0.61 0.57 0.60 0.61  CALCIUM 9.5 8.9 8.5* 8.4*  GFRNONAA  --   --  >60 >60  GFRAA  --   --  >60 >60  PROT 6.8 5.7*  --  5.6*  ALBUMIN 4.0 3.5  --  3.3*  AST 26 20  --  80*  ALT 12 9  --  16  ALKPHOS 65 61  --  69  BILITOT 0.4 0.8  --  1.3*     ASSESSMENT & PLAN:   # Macrocytic anemia secondary to B12 deficiency/ low folic acid. The etiology is unclear. Suspect malabsorption vs alcohol/ malnutrition. Check methylmalonic acid/CBC/ intrinsic factor antibody/ parietal antibodies.   Patient will need parenteral B12 supplementation; B12 thousand micrograms once a day for 7 days; then once a week for 4 weeks; and then once a month.   # Folate deficiency- on PO folate supp.   # Bilateral neck swelling secondary to anemia/. Recommend elevation.Suspected malnutrition   # alcohol/ smoking- discussed cessation.  # 30 minutes face-to-face with the patient discussing the above plan of care; more than 50% of time spent on counseling and coordination.     Earna Coder, MD 10/19/2015 10:04 AM

## 2015-10-20 LAB — ANTI-PARIETAL ANTIBODY: Parietal Cell Antibody-IgG: 1.9 Units (ref 0.0–20.0)

## 2015-10-21 LAB — METHYLMALONIC ACID, SERUM: METHYLMALONIC ACID, QUANTITATIVE: 148 nmol/L (ref 0–378)

## 2015-10-21 LAB — INTRINSIC FACTOR ANTIBODIES: INTRINSIC FACTOR: 1 [AU]/ml (ref 0.0–1.1)

## 2015-10-25 ENCOUNTER — Ambulatory Visit (INDEPENDENT_AMBULATORY_CARE_PROVIDER_SITE_OTHER): Payer: BLUE CROSS/BLUE SHIELD | Admitting: Family Medicine

## 2015-10-25 ENCOUNTER — Encounter: Payer: Self-pay | Admitting: Family Medicine

## 2015-10-25 VITALS — BP 120/74 | HR 77 | Temp 97.9°F | Wt 117.0 lb

## 2015-10-25 DIAGNOSIS — E039 Hypothyroidism, unspecified: Secondary | ICD-10-CM

## 2015-10-25 DIAGNOSIS — F1010 Alcohol abuse, uncomplicated: Secondary | ICD-10-CM | POA: Insufficient documentation

## 2015-10-25 DIAGNOSIS — D649 Anemia, unspecified: Secondary | ICD-10-CM | POA: Diagnosis not present

## 2015-10-25 DIAGNOSIS — R002 Palpitations: Secondary | ICD-10-CM | POA: Diagnosis not present

## 2015-10-25 DIAGNOSIS — E538 Deficiency of other specified B group vitamins: Secondary | ICD-10-CM | POA: Insufficient documentation

## 2015-10-25 NOTE — Progress Notes (Signed)
Subjective:   Patient ID: Nicole [name], female    DOB: 15-Sep-1980, 35 y.o.   MRN: [id number]  Nicole [name] is a pleasant 35 y.o. year old female female who presents to clinic today with Hospitalization Follow-up  on 10/25/2015  HPI:  Admitted to [facility] 4/20 - 10/13/15 for anemia after she was seen in our office and CBC here was 4.4. Notes reviewed.  She was symptomatic with DOE, palpitations- denied menorrhagia.  Given 3 units of PRBCs.   It was felt perhaps anemia was due to Vib 12 of folate deficiency and she was scheduled for follow up with hematology as an outpatient.  Also found to be newly hypothyroid and she was started on synthroid 25 mcg daily.  Lab Results  Component Value Date   TSH 11.391* 10/13/2015   She then saw Dr. Donneta Romberg, heme/onc on 10/19/15.  Note reviewed. He felt etiology remained unclear- malabsorption vs alcohol/malnutrition. Started Vit B12 supplementation- parenteral B12 supplementation; B12 thousand micrograms once a day for 7 days; then once a week for 4 weeks; and then once a month.  Also taking Folate.  Also ordered some additional labs which have not been resulted yet.  DOE and palpitations have improved a little.  She does endorse drinking two large liquor drinks a night.  Lab Results  Component Value Date   VITAMINB12 97* 10/13/2015    Lab Results  Component Value Date   WBC 5.6 10/19/2015   HGB 10.9* 10/19/2015   HCT 32.8* 10/19/2015   MCV 103.4* 10/19/2015   PLT 254 10/19/2015    Current Outpatient Prescriptions on File Prior to Visit  Medication Sig Dispense Refill  . cyanocobalamin (,VITAMIN B-12,) 1000 MCG/ML injection Inject 1 mL (1,000 mcg total) into the muscle once. 1057mcg/1ml Intramuscular once a day x1 week; then once every 4 weeks; and then once a month 30 mL 1  . folic acid (FOLVITE) 1 MG tablet Take 1 tablet (1 mg total) by mouth daily. 30 tablet 0  . levothyroxine (SYNTHROID, LEVOTHROID) 25 MCG tablet Take 1 tablet (25  mcg total) by mouth daily before breakfast. 30 tablet 2  . Multiple Vitamin (MULTIVITAMIN WITH MINERALS) TABS tablet Take 1 tablet by mouth daily. 30 tablet 0  . omeprazole (PRILOSEC) 40 MG capsule Take 1 capsule (40 mg total) by mouth daily. 30 capsule 2  . Syringe/Needle, Disp, (SYRINGE 3CC/23GX1") 23G X 1" 3 ML MISC 1 Syringe by Does not apply route daily. 50 each 3   No current facility-administered medications on file prior to visit.    No Known Allergies  Past Medical History  Diagnosis Date  . Kidney stones   . Migraines   . UTI (urinary tract infection)   . Anemia     Past Surgical History  Procedure Laterality Date  . Wisdom tooth extraction      Family History  Problem Relation Age of Onset  . Hypertension Mother   . Bladder Cancer Maternal Grandmother     Social History   Social History  . Marital Status: Married    Spouse Name: N/A  . Number of Children: N/A  . Years of Education: N/A   Occupational History  . Not on file.   Social History Main Topics  . Smoking status: Current Every Day Smoker -- 1.00 packs/day    Types: Cigarettes  . Smokeless tobacco: Never Used  . Alcohol Use: 4.2 oz/week    7 Standard drinks or equivalent per week     Comment:  daily liquor at night.  . Drug Use: No  . Sexual Activity: Yes   Other Topics Concern  . Not on file   Social History Narrative   The PMH, PSH, Social History, Family History, Medications, and allergies [facility], and have been updated if relevant.   Review of Systems  Constitutional: Positive for fatigue.  Respiratory: Negative.   Cardiovascular: Negative.   Gastrointestinal: Negative.   Genitourinary: Negative.   Musculoskeletal: Negative.   Skin: Negative.   Allergic/Immunologic: Negative.   Neurological: Negative.   Hematological: Negative.   Psychiatric/Behavioral: Negative.   All other systems reviewed and are negative.      Objective:    BP 120/74 mmHg  Pulse 77   Temp(Src) 97.9 F (36.6 C) (Oral)  Wt 117 lb (53.071 kg)  SpO2 100%  Wt Readings from Last 3 Encounters:  10/25/15 117 lb (53.071 kg)  10/19/15 117 lb 15.1 oz (53.5 kg)  10/12/15 120 lb 3.2 oz (54.522 kg)    Physical Exam  Constitutional: She is oriented to person, place, and time. She appears well-developed.  thin  HENT:  Head: Normocephalic.  Eyes: Conjunctivae are normal.  Cardiovascular: Normal rate and regular rhythm.   Pulmonary/Chest: Effort normal and breath sounds normal.  Abdominal: Soft.  Neurological: She is alert and oriented to person, place, and time.  Skin: Skin is warm and dry.  Psychiatric: She has a normal mood and affect. Her behavior is normal. Thought content normal.  Nursing note and vitals reviewed.         Assessment & Plan:   Symptomatic anemia  Palpitations  Vitamin B12 deficiency  Hypothyroidism, unspecified hypothyroidism type No Follow-up on file.

## 2015-10-25 NOTE — Patient Instructions (Signed)
Good to see you. Please come see me in about month.

## 2015-10-25 NOTE — Progress Notes (Signed)
Pre visit review using our clinic review tool, if applicable. No additional management support is needed unless otherwise documented below in the visit note. 

## 2015-10-25 NOTE — Assessment & Plan Note (Signed)
Continue synthroid at current dose. Recheck thyroid panel in a few weeks.

## 2015-10-25 NOTE — Assessment & Plan Note (Signed)
Continue vit b12 deficiency.

## 2015-10-25 NOTE — Assessment & Plan Note (Signed)
Positive CAGE questionairre but she is refusing attending AA meetings or other help.  I did advise NOT to quit cold [place] and we discussed how to go about this but she is "not wanting to stop drinking" for now.

## 2015-10-25 NOTE — Assessment & Plan Note (Addendum)
Secondary to malnutrition/alcohol abuse. CBC improving. Has follow up scheduled with GI- Dr. Servando SnareWohl- 11/08/2015. Continue vit b12 and folate.

## 2015-11-08 ENCOUNTER — Ambulatory Visit (INDEPENDENT_AMBULATORY_CARE_PROVIDER_SITE_OTHER): Payer: Self-pay | Admitting: Gastroenterology

## 2015-11-08 ENCOUNTER — Other Ambulatory Visit: Payer: Self-pay

## 2015-11-08 ENCOUNTER — Encounter (INDEPENDENT_AMBULATORY_CARE_PROVIDER_SITE_OTHER): Payer: Self-pay

## 2015-11-08 ENCOUNTER — Encounter: Payer: Self-pay | Admitting: Gastroenterology

## 2015-11-08 DIAGNOSIS — D500 Iron deficiency anemia secondary to blood loss (chronic): Secondary | ICD-10-CM

## 2015-11-08 DIAGNOSIS — R197 Diarrhea, unspecified: Secondary | ICD-10-CM

## 2015-11-08 DIAGNOSIS — F1010 Alcohol abuse, uncomplicated: Secondary | ICD-10-CM | POA: Diagnosis not present

## 2015-11-08 DIAGNOSIS — K580 Irritable bowel syndrome with diarrhea: Secondary | ICD-10-CM | POA: Diagnosis not present

## 2015-11-08 NOTE — Progress Notes (Signed)
Gastroenterology Consultation  Referring Provider:     Dianne DunAron, Talia M, MD Primary Care Physician:  Ruthe Mannanalia Aron, MD Primary Gastroenterologist:  Dr. Servando SnareWohl     Reason for Consultation:     Anemia        HPI:   Nicole [name] is a [age] y.o. y/o female referred for consultation & management of Anemia by Dr. Ruthe Mannanalia Aron, MD.  This patient comes today with a report of anemia. The patient had a workup by hematology and while she was in the hospital for the anemia. The patient was admitted with a hemoglobin of 4.4. The patient was found to have normal iron studies and in fact had high total iron. The patient was found to have B12 and folate deficiencies. The patient's haptoglobin was also low. There is no report of any black stools or bloody stools. The patient also was found to have heme-negative stools. The patient drinks approximately 3 mixed drinks every day. The patient also has had an AST 3 weeks ago that was 80 with her albumin being low. The patient was told to stop drinking and stop smoking by hematology at the last visit. The patient was sent to me for evaluation of the anemia and possible malabsorption. She also reports that she's had chronic diarrhea with weight loss over the last 7 years. The patient states that she has to go to the bathroom approximately 30 minutes after eating anything. The patient does have a history of irritable bowel syndrome.  Past Medical History  Diagnosis Date  . Kidney stones   . Migraines   . UTI (urinary tract infection)   . Anemia     Past Surgical History  Procedure Laterality Date  . Wisdom tooth extraction      Prior to Admission medications   Medication Sig Start Date End Date Taking? Authorizing Provider  cyanocobalamin (,VITAMIN B-12,) 1000 MCG/ML injection Inject 1 mL (1,000 mcg total) into the muscle once. 102900mcg/1ml Intramuscular once a day x1 week; then once every 4 weeks; and then once a month 10/19/15  Yes Earna CoderGovinda R Brahmanday, MD  folic acid  (FOLVITE) 1 MG tablet Take 1 tablet (1 mg total) by mouth daily. 10/13/15  Yes Enedina FinnerSona Patel, MD  levothyroxine (SYNTHROID, LEVOTHROID) 25 MCG tablet Take 1 tablet (25 mcg total) by mouth daily before breakfast. 10/13/15  Yes Enedina FinnerSona Patel, MD  Multiple Vitamin (MULTIVITAMIN WITH MINERALS) TABS tablet Take 1 tablet by mouth daily. 10/13/15  Yes Enedina FinnerSona Patel, MD  omeprazole (PRILOSEC) 40 MG capsule Take 1 capsule (40 mg total) by mouth daily. 10/13/15  Yes Enedina FinnerSona Patel, MD  Syringe/Needle, Disp, (SYRINGE 3CC/23GX1") 23G X 1" 3 ML MISC 1 Syringe by Does not apply route daily. 10/19/15  Yes Earna CoderGovinda R Brahmanday, MD    Family History  Problem Relation Age of Onset  . Hypertension Mother   . Bladder Cancer Maternal Grandmother      Social History  Substance Use Topics  . Smoking status: Current Every Day Smoker -- 1.00 packs/day    Types: Cigarettes  . Smokeless tobacco: Never Used  . Alcohol Use: 4.2 oz/week    7 Standard drinks or equivalent per week     Comment: daily liquor at night.    Allergies as of 11/08/2015  . (No Known Allergies)    Review of Systems:    All systems reviewed and negative except where noted in HPI.   Physical Exam:  There were no vitals taken for this visit. No  LMP recorded. Patient is not currently having periods (Reason: Irregular Periods). Psych:  Alert and cooperative. Normal mood and affect. General:   Alert,  Thin, pleasant and cooperative in NAD Head:  Normocephalic and atraumatic. Eyes:  Sclera clear, no icterus.   Conjunctiva pink. Ears:  Normal auditory acuity. Nose:  No deformity, discharge, or lesions. Mouth:  No deformity or lesions,oropharynx pink & moist. Neck:  Supple; no masses or thyromegaly. Lungs:  Respirations even and unlabored.  Clear throughout to auscultation.   No wheezes, crackles, or rhonchi. No acute distress. Heart:  Regular rate and rhythm; no murmurs, clicks, rubs, or gallops. Abdomen:  Normal bowel sounds.  No bruits.  Soft,  non-tender and non-distended without masses, hepatosplenomegaly or hernias noted.  No guarding or rebound tenderness.  Negative Carnett sign.   Rectal:  Deferred.  Msk:  Symmetrical without gross deformities.  Good, equal movement & strength bilaterally. Pulses:  Normal pulses noted. Extremities:  No clubbing or edema.  No cyanosis. Neurologic:  Alert and oriented x3;  grossly normal neurologically. Skin:  Intact with spider angiomata is on her chest.  No jaundice. Lymph Nodes:  No significant cervical adenopathy. Psych:  Alert and cooperative. Normal mood and affect.  Imaging Studies: No results found.  Assessment and Plan:   Nicole [name] is a 35 y.o. y/o female who comes in today with a history of alcohol abuse and abnormal liver enzymes with low B12 and folate. The patient was also found to have spider angiomas and low albumin. These are all consistent with significant liver disease. The patient has been encouraged to stop her alcohol use completely. The patient will also be set up for colonoscopy to rule out inflammatory bowel disease as the cause of possible malabsorption and for her diarrhea. It is more likely that the patient's anemia is due to vitamin deficiencies due to alcohol abuse. The patient has been explained the plan and agrees with it.   Note: This dictation was prepared with Dragon dictation along with smaller phrase technology. Any transcriptional errors that result from this process are unintentional.

## 2015-11-14 ENCOUNTER — Encounter: Payer: Self-pay | Admitting: *Deleted

## 2015-11-14 ENCOUNTER — Other Ambulatory Visit: Payer: Self-pay

## 2015-11-14 MED ORDER — PEG 3350-KCL-NABCB-NACL-NASULF 236 G PO SOLR: 4000.0000 mL | Freq: Once | ORAL | Status: DC

## 2015-11-16 NOTE — Discharge Instructions (Signed)

## 2015-11-17 ENCOUNTER — Ambulatory Visit: Payer: BLUE CROSS/BLUE SHIELD | Admitting: Anesthesiology

## 2015-11-17 ENCOUNTER — Ambulatory Visit
Admission: RE | Admit: 2015-11-17 | Discharge: 2015-11-17 | Disposition: A | Discharge status: Home / Self Care | Payer: BLUE CROSS/BLUE SHIELD | Source: Ambulatory Visit | Attending: Gastroenterology | Admitting: Gastroenterology

## 2015-11-17 ENCOUNTER — Other Ambulatory Visit: Payer: BLUE CROSS/BLUE SHIELD

## 2015-11-17 ENCOUNTER — Encounter
Admission: RE | Disposition: A | Discharge status: Home / Self Care | Payer: Self-pay | Source: Ambulatory Visit | Attending: Gastroenterology

## 2015-11-17 ENCOUNTER — Ambulatory Visit: Payer: BLUE CROSS/BLUE SHIELD | Admitting: Internal Medicine

## 2015-11-17 DIAGNOSIS — G43909 Migraine, unspecified, not intractable, without status migrainosus: Secondary | ICD-10-CM | POA: Diagnosis not present

## 2015-11-17 DIAGNOSIS — Z79899 Other long term (current) drug therapy: Secondary | ICD-10-CM | POA: Insufficient documentation

## 2015-11-17 DIAGNOSIS — F17210 Nicotine dependence, cigarettes, uncomplicated: Secondary | ICD-10-CM | POA: Diagnosis not present

## 2015-11-17 DIAGNOSIS — E039 Hypothyroidism, unspecified: Secondary | ICD-10-CM | POA: Diagnosis not present

## 2015-11-17 DIAGNOSIS — K529 Noninfective gastroenteritis and colitis, unspecified: Secondary | ICD-10-CM | POA: Diagnosis not present

## 2015-11-17 DIAGNOSIS — K580 Irritable bowel syndrome with diarrhea: Secondary | ICD-10-CM | POA: Diagnosis not present

## 2015-11-17 DIAGNOSIS — R197 Diarrhea, unspecified: Secondary | ICD-10-CM | POA: Diagnosis present

## 2015-11-17 DIAGNOSIS — Z87442 Personal history of urinary calculi: Secondary | ICD-10-CM | POA: Insufficient documentation

## 2015-11-17 DIAGNOSIS — I499 Cardiac arrhythmia, unspecified: Secondary | ICD-10-CM | POA: Insufficient documentation

## 2015-11-17 DIAGNOSIS — D500 Iron deficiency anemia secondary to blood loss (chronic): Secondary | ICD-10-CM | POA: Diagnosis not present

## 2015-11-17 HISTORY — DX: Cardiac arrhythmia, unspecified: I49.9

## 2015-11-17 HISTORY — PX: COLONOSCOPY WITH PROPOFOL: SHX5780

## 2015-11-17 HISTORY — DX: Hypothyroidism, unspecified: E03.9

## 2015-11-17 SURGERY — COLONOSCOPY WITH PROPOFOL
Anesthesia: Monitor Anesthesia Care | Wound class: Contaminated

## 2015-11-17 MED ORDER — LACTATED RINGERS IV SOLN
INTRAVENOUS | Status: DC
  Administered 2015-11-17: 08:00:00 via INTRAVENOUS

## 2015-11-17 MED ORDER — STERILE WATER FOR IRRIGATION IR SOLN
Status: DC | PRN
  Administered 2015-11-17: 08:00:00

## 2015-11-17 MED ORDER — PROPOFOL 10 MG/ML IV BOLUS
INTRAVENOUS | Status: DC | PRN
  Administered 2015-11-17: 100 mg via INTRAVENOUS

## 2015-11-17 MED ORDER — ACETAMINOPHEN 325 MG PO TABS: 325.0000 mg | ORAL_TABLET | ORAL | Status: DC | PRN

## 2015-11-17 MED ORDER — LIDOCAINE HCL (CARDIAC) 20 MG/ML IV SOLN
INTRAVENOUS | Status: DC | PRN
  Administered 2015-11-17: 40 mg via INTRAVENOUS

## 2015-11-17 MED ORDER — ACETAMINOPHEN 160 MG/5ML PO SOLN: 325.0000 mg | ORAL | Status: DC | PRN

## 2015-11-17 MED ORDER — ONDANSETRON HCL 4 MG/2ML IJ SOLN: 4.0000 mg | Freq: Once | INTRAMUSCULAR | Status: DC | PRN

## 2015-11-17 SURGICAL SUPPLY — 22 items
CANISTER SUCT 1200ML W/VALVE (MISCELLANEOUS) ×2 IMPLANT
CLIP HMST 235XBRD CATH ROT (MISCELLANEOUS) IMPLANT
CLIP RESOLUTION 360 11X235 (MISCELLANEOUS)
FCP ESCP3.2XJMB 240X2.8X (MISCELLANEOUS)
FORCEPS BIOP RAD 4 LRG CAP 4 (CUTTING FORCEPS) ×1 IMPLANT
FORCEPS BIOP RJ4 240 W/NDL (MISCELLANEOUS)
FORCEPS ESCP3.2XJMB 240X2.8X (MISCELLANEOUS) IMPLANT
GOWN CVR UNV OPN BCK APRN NK (MISCELLANEOUS) ×2 IMPLANT
GOWN ISOL THUMB LOOP REG UNIV (MISCELLANEOUS) ×4
INJECTOR VARIJECT VIN23 (MISCELLANEOUS) IMPLANT
KIT DEFENDO VALVE AND CONN (KITS) IMPLANT
KIT ENDO PROCEDURE OLY (KITS) ×2 IMPLANT
MARKER SPOT ENDO TATTOO 5ML (MISCELLANEOUS) IMPLANT
PAD GROUND ADULT SPLIT (MISCELLANEOUS) IMPLANT
PROBE APC STR FIRE (PROBE) IMPLANT
SNARE SHORT THROW 13M SML OVAL (MISCELLANEOUS) IMPLANT
SNARE SHORT THROW 30M LRG OVAL (MISCELLANEOUS) IMPLANT
SNARE SNG USE RND 15MM (INSTRUMENTS) IMPLANT
SPOT EX ENDOSCOPIC TATTOO (MISCELLANEOUS)
TRAP ETRAP POLY (MISCELLANEOUS) IMPLANT
VARIJECT INJECTOR VIN23 (MISCELLANEOUS)
WATER STERILE IRR 250ML POUR (IV SOLUTION) ×2 IMPLANT

## 2015-11-17 NOTE — H&P (Signed)
[facility]  [address], [address] [place], [place] [address] Phone: [phone] Fax : [phone]  Primary Care Physician:  Ruthe Mannan, MD Primary Gastroenterologist:  Dr. Servando Snare  Pre-Procedure History & Physical: HPI:  Nicole [name] is a 35 y.o. female is here for an colonoscopy.   Past Medical History  Diagnosis Date  . Kidney stones   . Migraines   . UTI (urinary tract infection)   . Anemia   . Dysrhythmia     T[place]chy in HS.  Irregular with anemia.  Better now  . Hypothyroidism     Past Surgical History  Procedure Laterality Date  . Wisdom tooth extraction      Prior to Admission medications   Medication Sig Start Date End Date Taking? Authorizing Provider  cyanocobalamin (,VITAMIN B-12,) 1000 MCG/ML injection Inject 1 mL (1,000 mcg total) into the muscle once. 1073mcg/1ml Intramuscular once a day x1 week; then once every 4 weeks; and then once a month 10/19/15  Yes Earna Coder, MD  levothyroxine (SYNTHROID, LEVOTHROID) 25 MCG tablet Take 1 tablet (25 mcg total) by mouth daily before breakfast. 10/13/15  Yes Enedina Finner, MD  Multiple Vitamin (MULTIVITAMIN WITH MINERALS) TABS tablet Take 1 tablet by mouth daily. 10/13/15  Yes Enedina Finner, MD  omeprazole (PRILOSEC) 40 MG capsule Take 1 capsule (40 mg total) by mouth daily. 10/13/15  Yes Enedina Finner, MD  polyethylene glycol (GOLYTELY) 236 g solution Take 4,000 mLs by mouth once. Drink one 8 oz glass every 20 mins until stools are clear 11/14/15  Yes Midge Minium, MD  Syringe/Needle, Disp, (SYRINGE 3CC/23GX1") 23G X 1" 3 ML MISC 1 Syringe by Does not apply route daily. 10/19/15  Yes Earna Coder, MD  folic acid (FOLVITE) 1 MG tablet Take 1 tablet (1 mg total) by mouth daily. Patient not taking: Reported on 11/14/2015 10/13/15   Enedina Finner, MD    Allergies as of 11/08/2015  . (No Known Allergies)    Family History  Problem Relation Age of Onset  . Hypertension Mother   . Bladder Cancer Maternal  Grandmother     Social History   Social History  . Marital Status: Married    Spouse Name: N/A  . Number of Children: N/A  . Years of Education: N/A   Occupational History  . Not on file.   Social History Main Topics  . Smoking status: Current Every Day Smoker -- 1.00 packs/day for 20 years    Types: Cigarettes  . Smokeless tobacco: Never Used  . Alcohol Use: 12.6 oz/week    7 Standard drinks or equivalent, 7 Glasses of wine, 7 Shots of liquor per week     Comment: daily liquor at night.  . Drug Use: No  . Sexual Activity: Yes   Other Topics Concern  . Not on file   Social History Narrative    Review of Systems: See HPI, otherwise negative ROS  Physical Exam: BP 148/83 mmHg  Pulse 89  Temp(Src) 97.9 F (36.6 C) (Temporal)  Resp 16  Ht  (1.803 m)  Wt 116 lb (52.617 kg)  BMI 16.19 kg/m2  SpO2 100%  LMP  General:   Alert,  pleasant and cooperative in NAD Head:  Normocephalic and atraumatic. Neck:  Supple; no masses or thyromegaly. Lungs:  Clear throughout to auscultation.    Heart:  Regular rate and rhythm. Abdomen:  Soft, nontender and nondistended. Normal bowel sounds, without guarding, and without rebound.   Neurologic:  Alert and  oriented x4;  grossly normal neurologically.  Impression/Plan: Nicole [name] is here for an colonoscopy to be performed for diarrhea  Risks, benefits, limitations, and alternatives regarding  colonoscopy have been reviewed with the patient.  Questions have been answered.  All parties agreeable.   Midge Miniumarren Dazia Lippold, MD  11/17/2015, 7:24 AM

## 2015-11-17 NOTE — Transfer of Care (Signed)
Immediate Anesthesia Transfer of Care Note  Patient: Nicole [name]  Procedure(s) Performed: Procedure(s): COLONOSCOPY WITH PROPOFOL (N/A)  Patient Location: PACU  Anesthesia Type: MAC  Level of Consciousness: awake, alert  and patient cooperative  Airway and Oxygen Therapy: Patient Spontanous Breathing and Patient connected to supplemental oxygen  Post-op Assessment: Post-op Vital signs reviewed, Patient's Cardiovascular Status Stable, Respiratory Function Stable, Patent Airway and No signs of Nausea or vomiting  Post-op Vital Signs: Reviewed and stable  Complications: No apparent anesthesia complications

## 2015-11-17 NOTE — Anesthesia Postprocedure Evaluation (Signed)
Anesthesia Post Note  Patient: Nicole [name]  Procedure(s) Performed: Procedure(s) (LRB): COLONOSCOPY WITH PROPOFOL (N/A)  Patient location during evaluation: PACU Anesthesia Type: MAC Level of consciousness: awake and alert Pain management: pain level controlled Vital Signs Assessment: post-procedure vital signs reviewed and stable Respiratory status: spontaneous breathing, nonlabored ventilation, respiratory function stable and patient connected to nasal cannula oxygen Cardiovascular status: stable and blood pressure returned to baseline Anesthetic complications: no    Durene Fruitshomas,  Ediel Unangst G

## 2015-11-17 NOTE — Anesthesia Procedure Notes (Signed)
Procedure Name: MAC Performed by: Amadi Frady Pre-anesthesia Checklist: Patient identified, Emergency Drugs available, Suction available, Patient being monitored and Timeout performed Patient Re-evaluated:Patient Re-evaluated prior to inductionOxygen Delivery Method: Nasal cannula       

## 2015-11-17 NOTE — Op Note (Signed)
[facility] Gastroenterology Patient Name: Nicole [name] Procedure Date: 11/17/2015 8:05 AM MRN: [id number] Account #: [id number] Date of Birth: 1980-12-17 Admit Type: Outpatient Age: [age] [facility] Gender: Female Note Status: Finalized Procedure:            Colonoscopy Indications:          Chronic diarrhea Providers:            Nicole Miniumarren Farida Mcreynolds, MD Referring MD:         Nicole [name] (Referring MD) Medicines:            Propofol per Anesthesia Complications:        No immediate complications. Procedure:            Pre-Anesthesia Assessment:                       - Prior to the procedure, a History and Physical was                        performed, and patient medications and allergies were                        reviewed. The patient's tolerance of previous                        anesthesia was also reviewed. The risks and benefits of                        the procedure and the sedation options and risks were                        discussed with the patient. All questions were                        answered, and informed consent was obtained. Prior                        Anticoagulants: The patient has taken no previous                        anticoagulant or antiplatelet agents. ASA Grade                        Assessment: II - A patient with mild systemic disease.                        After reviewing the risks and benefits, the patient was                        deemed in satisfactory condition to undergo the                        procedure.                       After obtaining informed consent, the colonoscope was                        passed under direct vision. Throughout the procedure,  the patient's blood pressure, pulse, and oxygen                        saturations were monitored continuously. The Olympus                        CF-HQ190L Colonoscope (S#. [id number]) was introduced                        through the anus  and advanced to the the cecum,                        identified by appendiceal orifice and ileocecal valve.                        The colonoscopy was performed without difficulty. The                        patient tolerated the procedure well. The quality of                        the bowel preparation was excellent. Findings:      The perianal and digital rectal examinations were normal.      The colon (entire examined portion) appeared normal. Random biopsies       were obtained with cold forceps for histology randomly in the entire       colon. Impression:           - The entire examined colon is normal.                       - Random biopsies were obtained in the entire colon. Recommendation:       - Await pathology results. Procedure Code(s):    --- Professional ---                       [phone], Colonoscopy, flexible; with biopsy, single or                        multiple Diagnosis Code(s):    --- Professional ---                       K52.9, Noninfective gastroenteritis and colitis,                        unspecified CPT copyright 2016 American Medical Association. All rights reserved. The codes documented in this report are preliminary and upon coder review may  be revised to meet current compliance requirements. Nicole Minium, MD 11/17/2015 8:22:59 AM This report has been signed electronically. Number of Addenda: 0 Note Initiated On: 11/17/2015 8:05 AM Scope Withdrawal Time: 0 hours 7 minutes 22 seconds  Total Procedure Duration: 0 hours 10 minutes 44 seconds       [facility]

## 2015-11-17 NOTE — Anesthesia Preprocedure Evaluation (Signed)
Anesthesia Evaluation  Patient identified by MRN, date of birth, ID band Patient awake    Reviewed: Allergy & Precautions, H&P , NPO status   Airway Mallampati: II  TM Distance: >3 FB Neck ROM: full    Dental   Pulmonary Current Smoker,    breath sounds clear to auscultation       Cardiovascular + dysrhythmias  Rhythm:regular Rate:Normal     Neuro/Psych  Headaches,    GI/Hepatic   Endo/Other  Hypothyroidism   Renal/GU      Musculoskeletal   Abdominal   Peds  Hematology  (+) anemia ,   Anesthesia Other Findings   Reproductive/Obstetrics                             Anesthesia Physical Anesthesia Plan  ASA: II  Anesthesia Plan: MAC   Post-op Pain Management:    Induction:   Airway Management Planned:   Additional Equipment:   Intra-op Plan:   Post-operative Plan:   Informed Consent: I have reviewed the patients History and Physical, chart, labs and discussed the procedure including the risks, benefits and alternatives for the proposed anesthesia with the patient or authorized representative who has indicated his/her understanding and acceptance.     Plan Discussed with: CRNA  Anesthesia Plan Comments:         Anesthesia Quick Evaluation

## 2015-11-20 ENCOUNTER — Encounter: Payer: Self-pay | Admitting: Gastroenterology

## 2015-11-23 ENCOUNTER — Encounter: Payer: Self-pay | Admitting: Gastroenterology

## 2015-11-24 ENCOUNTER — Telehealth: Payer: Self-pay

## 2015-11-24 NOTE — Telephone Encounter (Signed)
-----   Message from Midge Miniumarren Wohl, MD sent at 11/23/2015 11:34 AM EDT ----- Let the patient know that the biopsies were normal and if she is continuing to have diarrhea and then the patient should follow-up in the office for further investigation.

## 2015-11-24 NOTE — Telephone Encounter (Signed)
Pt notified of results. Will call back if she needs a follow up appt.

## 2015-11-29 ENCOUNTER — Ambulatory Visit: Payer: BLUE CROSS/BLUE SHIELD | Admitting: Family Medicine

## 2015-12-04 ENCOUNTER — Encounter: Payer: Self-pay | Admitting: Oncology

## 2015-12-04 ENCOUNTER — Inpatient Hospital Stay (HOSPITAL_BASED_OUTPATIENT_CLINIC_OR_DEPARTMENT_OTHER): Payer: BLUE CROSS/BLUE SHIELD | Admitting: Oncology

## 2015-12-04 ENCOUNTER — Inpatient Hospital Stay: Payer: BLUE CROSS/BLUE SHIELD | Attending: Oncology

## 2015-12-04 VITALS — BP 113/73 | HR 90 | Temp 98.4°F | Ht 71.0 in | Wt 112.6 lb

## 2015-12-04 DIAGNOSIS — D649 Anemia, unspecified: Secondary | ICD-10-CM

## 2015-12-04 DIAGNOSIS — D519 Vitamin B12 deficiency anemia, unspecified: Secondary | ICD-10-CM | POA: Insufficient documentation

## 2015-12-04 DIAGNOSIS — Z87442 Personal history of urinary calculi: Secondary | ICD-10-CM

## 2015-12-04 DIAGNOSIS — R5383 Other fatigue: Secondary | ICD-10-CM | POA: Insufficient documentation

## 2015-12-04 DIAGNOSIS — R531 Weakness: Secondary | ICD-10-CM | POA: Insufficient documentation

## 2015-12-04 DIAGNOSIS — Z8052 Family history of malignant neoplasm of bladder: Secondary | ICD-10-CM | POA: Insufficient documentation

## 2015-12-04 DIAGNOSIS — F17210 Nicotine dependence, cigarettes, uncomplicated: Secondary | ICD-10-CM | POA: Insufficient documentation

## 2015-12-04 DIAGNOSIS — E039 Hypothyroidism, unspecified: Secondary | ICD-10-CM | POA: Insufficient documentation

## 2015-12-04 DIAGNOSIS — Z8669 Personal history of other diseases of the nervous system and sense organs: Secondary | ICD-10-CM | POA: Diagnosis not present

## 2015-12-04 DIAGNOSIS — E538 Deficiency of other specified B group vitamins: Secondary | ICD-10-CM

## 2015-12-04 DIAGNOSIS — Z87440 Personal history of urinary (tract) infections: Secondary | ICD-10-CM

## 2015-12-04 LAB — CBC WITH DIFFERENTIAL/PLATELET
BASOS ABS: 0 10*3/uL (ref 0–0.1)
BASOS PCT: 1 %
Eosinophils Absolute: 0.2 10*3/uL (ref 0–0.7)
Eosinophils Relative: 3 %
HEMATOCRIT: 44.7 % (ref 35.0–47.0)
Hemoglobin: 15.1 g/dL (ref 12.0–16.0)
Lymphocytes Relative: 31 %
Lymphs Abs: 2.7 10*3/uL (ref 1.0–3.6)
MCH: 32.5 pg (ref 26.0–34.0)
MCHC: 33.7 g/dL (ref 32.0–36.0)
MCV: 96.4 fL (ref 80.0–100.0)
Monocytes Absolute: 0.7 10*3/uL (ref 0.2–0.9)
Monocytes Relative: 9 %
NEUTROS ABS: 5 10*3/uL (ref 1.4–6.5)
NEUTROS PCT: 58 %
Platelets: 197 10*3/uL (ref 150–440)
RBC: 4.64 MIL/uL (ref 3.80–5.20)
RDW: 14.9 % — ABNORMAL HIGH (ref 11.5–14.5)
WBC: 8.7 10*3/uL (ref 3.6–11.0)

## 2015-12-04 LAB — BASIC METABOLIC PANEL
ANION GAP: 8 (ref 5–15)
BUN: 7 mg/dL (ref 6–20)
CALCIUM: 8.4 mg/dL — AB (ref 8.9–10.3)
CHLORIDE: 105 mmol/L (ref 101–111)
CO2: 26 mmol/L (ref 22–32)
CREATININE: 0.73 mg/dL (ref 0.44–1.00)
GFR calc non Af Amer: >60 mL/min (ref >60)
Glucose, Bld: 113 mg/dL — ABNORMAL HIGH (ref 65–99)
Potassium: 3.1 mmol/L — ABNORMAL LOW (ref 3.5–5.1)
Sodium: 139 mmol/L (ref 135–145)

## 2015-12-04 NOTE — Progress Notes (Signed)
Patient here for anemia follow up. Patient has stopped drinking ETOH which is causing sleep issues. No other concerns.

## 2015-12-04 NOTE — Progress Notes (Signed)
[place]  [phone]  [phone]     Nicole [name] DOB: 31-Aug-1980  MR#: [id number]  XIP#:[id number]  Patient Care Team: Lucille Passy, MD as PCP - General (Family Medicine)  CHIEF COMPLAINT:  Chief Complaint  Patient presents with  . Anemia  . Follow-up    INTERVAL HISTORY: Patient is here for lab results and evaluation for anemia. She is currently doing well other than fatigue. She quit drinking alcohol yesterday. She finished B12 injections and folate tablets about 2 weeks ago. She denies fever or chills. She denies easy bruising or bleeding. She denies chest pain or shortness of breath. She denies any abdominal pain, nausea, vomiting, diarrhea or constipation.   REVIEW OF SYSTEMS:   Review of Systems  Constitutional: Positive for malaise/fatigue. Negative for fever and chills.  HENT: Negative.   Eyes: Negative.   Respiratory: Negative.   Cardiovascular: Negative.   Gastrointestinal: Negative.   Genitourinary: Negative.   Musculoskeletal: Negative.   Skin: Negative.   Neurological: Positive for weakness.  Endo/Heme/Allergies: Negative.   Psychiatric/Behavioral: Negative.     As per HPI. Otherwise, a complete review of systems is negatve.  ONCOLOGY HISTORY:  No history exists.    PAST MEDICAL HISTORY: Past Medical History  Diagnosis Date  . Kidney stones   . Migraines   . UTI (urinary tract infection)   . Anemia   . Dysrhythmia     Tacchy in HS.  Irregular with anemia.  Better now  . Hypothyroidism     PAST SURGICAL HISTORY: Past Surgical History  Procedure Laterality Date  . Wisdom tooth extraction    . Colonoscopy with propofol N/A 11/17/2015    Procedure: COLONOSCOPY WITH PROPOFOL;  Surgeon: Lucilla Lame, MD;  Location: [facility];  Service: Endoscopy;  Laterality: N/A;    FAMILY HISTORY Family History  Problem Relation Age of Onset  . Hypertension Mother   . Bladder Cancer Maternal Grandmother      GYNECOLOGIC HISTORY:  No LMP recorded. Patient is not currently having periods (Reason: Irregular Periods).     ADVANCED DIRECTIVES:    HEALTH MAINTENANCE: Social History  Substance Use Topics  . Smoking status: Current Every Day Smoker -- 1.00 packs/day for 20 years    Types: Cigarettes  . Smokeless tobacco: Never Used  . Alcohol Use: 12.6 oz/week    7 Standard drinks or equivalent, 7 Glasses of wine, 7 Shots of liquor per week     Comment: daily liquor at night.    No Known Allergies  Current Outpatient Prescriptions  Medication Sig Dispense Refill  . levothyroxine (SYNTHROID, LEVOTHROID) 25 MCG tablet Take 1 tablet (25 mcg total) by mouth daily before breakfast. 30 tablet 2  . Multiple Vitamin (MULTIVITAMIN WITH MINERALS) TABS tablet Take 1 tablet by mouth daily. 30 tablet 0   No current facility-administered medications for this visit.    OBJECTIVE: BP 113/73 mmHg  Pulse 90  Temp(Src) 98.4 F (36.9 C) (Tympanic)  Ht '5\' 11"'$  (1.803 m)  Wt 112 lb 9.6 oz (51.075 kg)  BMI 15.71 kg/m2   Body mass index is 15.71 kg/(m^2).    ECOG FS:1 - Symptomatic but completely ambulatory  General: Thin built moderately nourished Caucasian female patient; Alert, no distress and comfortable. Accompanied by daughter Eyes: No pallor or icterus Lungs: Clear to auscultation bilaterally. Heart: Regular rate and rhythm.  Abdomen: Soft, nontender, nondistended. Musculoskeletal: No edema, cyanosis Neuro: Alert, answering all questions appropriately. Skin: No rashes or petechiae noted.  Psych: Normal affect.  LAB RESULTS:  Appointment on 12/04/2015  Component Date Value Ref Range Status  . WBC 12/04/2015 8.7  3.6 - 11.0 K/uL Final  . RBC 12/04/2015 4.64  3.80 - 5.20 MIL/uL Final  . Hemoglobin 12/04/2015 15.1  12.0 - 16.0 g/dL Final  . HCT 12/04/2015 44.7  35.0 - 47.0 % Final  . MCV 12/04/2015 96.4  80.0 - 100.0 fL Final  . MCH 12/04/2015 32.5  26.0 - 34.0 pg Final  . MCHC  12/04/2015 33.7  32.0 - 36.0 g/dL Final  . RDW 12/04/2015 14.9* 11.5 - 14.5 % Final  . Platelets 12/04/2015 197  150 - 440 K/uL Final  . Neutrophils Relative % 12/04/2015 58   Final  . Neutro Abs 12/04/2015 5.0  1.4 - 6.5 K/uL Final  . Lymphocytes Relative 12/04/2015 31   Final  . Lymphs Abs 12/04/2015 2.7  1.0 - 3.6 K/uL Final  . Monocytes Relative 12/04/2015 9   Final  . Monocytes Absolute 12/04/2015 0.7  0.2 - 0.9 K/uL Final  . Eosinophils Relative 12/04/2015 3   Final  . Eosinophils Absolute 12/04/2015 0.2  0 - 0.7 K/uL Final  . Basophils Relative 12/04/2015 1   Final  . Basophils Absolute 12/04/2015 0.0  0 - 0.1 K/uL Final  . Sodium 12/04/2015 139  135 - 145 mmol/L Final  . Potassium 12/04/2015 3.1* 3.5 - 5.1 mmol/L Final  . Chloride 12/04/2015 105  101 - 111 mmol/L Final  . CO2 12/04/2015 26  22 - 32 mmol/L Final  . Glucose, Bld 12/04/2015 113* 65 - 99 mg/dL Final  . BUN 12/04/2015 7  6 - 20 mg/dL Final  . Creatinine, Ser 12/04/2015 0.73  0.44 - 1.00 mg/dL Final  . Calcium 12/04/2015 8.4* 8.9 - 10.3 mg/dL Final  . GFR calc non Af Amer 12/04/2015 >60  >60 mL/min Final  . GFR calc Af Amer 12/04/2015 >60  >60 mL/min Final   Comment: (NOTE) The eGFR has been calculated using the CKD EPI equation. This calculation has not been validated in all clinical situations. eGFR's persistently <60 mL/min signify possible Chronic Kidney Disease.   . Anion gap 12/04/2015 8  5 - 15 Final    STUDIES: No results found.  ASSESSMENT: Macrocytic anemia secondary to B12 deficiency/low folic acid.  PLAN:    1. Anemia: HGB today is 15.1.  Patient finished H67 and folic acid supplements about 2 weeks ago. Recommend continued oral OTC R91 and folic acid. Patient does not wish to follow up here in the future. She wants to have follow up with her pcp, Dr Deborra Medina. She has appointment there next week.   Patient expressed understanding and was in agreement with this plan. She also understands that She  can call clinic at any time with any questions, concerns, or complaints.   Dr. Rogue Bussing was available for consultation and review of plan of care for this patient.  Mayra Reel, NP   12/04/2015 11:59 AM

## 2015-12-07 ENCOUNTER — Encounter: Payer: Self-pay | Admitting: Family Medicine

## 2015-12-07 ENCOUNTER — Ambulatory Visit (INDEPENDENT_AMBULATORY_CARE_PROVIDER_SITE_OTHER): Payer: BLUE CROSS/BLUE SHIELD | Admitting: Family Medicine

## 2015-12-07 VITALS — BP 102/70 | HR 85 | Temp 98.0°F | Wt 114.0 lb

## 2015-12-07 DIAGNOSIS — D519 Vitamin B12 deficiency anemia, unspecified: Secondary | ICD-10-CM | POA: Diagnosis not present

## 2015-12-07 DIAGNOSIS — D649 Anemia, unspecified: Secondary | ICD-10-CM

## 2015-12-07 DIAGNOSIS — E538 Deficiency of other specified B group vitamins: Secondary | ICD-10-CM | POA: Diagnosis not present

## 2015-12-07 DIAGNOSIS — F1010 Alcohol abuse, uncomplicated: Secondary | ICD-10-CM | POA: Diagnosis not present

## 2015-12-07 DIAGNOSIS — E038 Other specified hypothyroidism: Secondary | ICD-10-CM | POA: Diagnosis not present

## 2015-12-07 LAB — VITAMIN B12: VITAMIN B 12: 1339 pg/mL — AB (ref 211–911)

## 2015-12-07 LAB — T4, FREE: FREE T4: 0.75 ng/dL (ref 0.60–1.60)

## 2015-12-07 LAB — TSH: TSH: 4.39 u[IU]/mL (ref 0.35–4.50)

## 2015-12-07 NOTE — Progress Notes (Signed)
Pre visit review using our clinic review tool, if applicable. No additional management support is needed unless otherwise documented below in the visit note. 

## 2015-12-07 NOTE — Assessment & Plan Note (Signed)
Recheck Vit B12 today. She was unsure of what does of Vit B12 she should be taking- advised 1000 mcg daily with 400 mcg of folic acid until we get results from today. The patient indicates understanding of these issues and agrees with the plan.

## 2015-12-07 NOTE — Assessment & Plan Note (Signed)
DOE and LE completely resolved.

## 2015-12-07 NOTE — Assessment & Plan Note (Signed)
Continue current dose of synthroid . Check thyroid panel today. The patient indicates understanding of these issues and agrees with the plan.

## 2015-12-07 NOTE — Patient Instructions (Addendum)
Great to see you. We will call you with your lab results.  Go ahead and by Vit B12 500 mcg- start out taking 1000 mcg (2 tablets or gummies) daily.    Continue Folic acid as well- 400 mcg daily. We will call you with your results from today.

## 2015-12-07 NOTE — Assessment & Plan Note (Signed)
Quit drinking. Congratulated her and advised that she attend AA meetings for additional support. The patient indicates understanding of these issues and agrees with the plan.

## 2015-12-07 NOTE — Progress Notes (Signed)
Subjective:   Patient ID: Nicole [name], female    DOB: 01-Jan-1981, 35 y.o.   MRN: [id number]  Nicole [name] is a pleasant 35 y.o. year old female who presents to clinic today with Follow-up  on 12/07/2015  HPI:  Anemia- Admitted to [facility] 4/20 - 10/13/15 for anemia after she was seen in our office and CBC here was 4.4. Notes reviewed.  She was symptomatic with DOE, palpitations- denied menorrhagia.  Given 3 units of PRBCs.   It was felt perhaps anemia was due to Vib 12 of folate deficiency and she was scheduled for follow up with hematology as an outpatient.  Also found to be newly hypothyroid and she was started on synthroid 25 mcg daily.  Lab Results  Component Value Date   TSH 11.391* 10/13/2015   She then saw Dr. Donneta Romberg, heme/onc on 10/19/15.  Note reviewed. He felt etiology remained unclear- malabsorption vs alcohol/malnutrition. Started Vit B12 supplementation- parenteral B12 supplementation; B12 thousand micrograms once a day for 7 days; then once a week for 4 weeks; and then once a month, along with folate.  Finished her B12 injections and folate tablets approximately 2 weeks ago. . Lab Results  Component Value Date   WBC 8.7 12/04/2015   HGB 15.1 12/04/2015   HCT 44.7 12/04/2015   MCV 96.4 12/04/2015   PLT 197 12/04/2015   Lab Results  Component Value Date   VITAMINB12 97* 10/13/2015     Saw Genevie Cheshire, NP (oncology) on 12/04/15. Note reviewed.  Advised continued OTC oral B12 and folic acid.  Also quit drinking this week.  Feels good.  Lab Results  Component Value Date   VITAMINB12 97* 10/13/2015    Lab Results  Component Value Date   WBC 8.7 12/04/2015   HGB 15.1 12/04/2015   HCT 44.7 12/04/2015   MCV 96.4 12/04/2015   PLT 197 12/04/2015    Current Outpatient Prescriptions on File Prior to Visit  Medication Sig Dispense Refill  . levothyroxine (SYNTHROID, LEVOTHROID) 25 MCG tablet Take 1 tablet (25 mcg total) by mouth daily before  breakfast. 30 tablet 2  . Multiple Vitamin (MULTIVITAMIN WITH MINERALS) TABS tablet Take 1 tablet by mouth daily. 30 tablet 0   No current facility-administered medications on file prior to visit.    No Known Allergies  Past Medical History  Diagnosis Date  . Kidney stones   . Migraines   . UTI (urinary tract infection)   . Anemia   . Dysrhythmia     Tacchy in HS.  Irregular with anemia.  Better now  . Hypothyroidism     Past Surgical History  Procedure Laterality Date  . Wisdom tooth extraction    . Colonoscopy with propofol N/A 11/17/2015    Procedure: COLONOSCOPY WITH PROPOFOL;  Surgeon: Midge Minium, MD;  Location: [facility];  Service: Endoscopy;  Laterality: N/A;    Family History  Problem Relation Age of Onset  . Hypertension Mother   . Bladder Cancer Maternal Grandmother     Social History   Social History  . Marital Status: Married    Spouse Name: N/A  . Number of Children: N/A  . Years of Education: N/A   Occupational History  . Not on file.   Social History Main Topics  . Smoking status: Current Every Day Smoker -- 1.00 packs/day for 20 years    Types: Cigarettes  . Smokeless tobacco: Never Used  . Alcohol Use: 12.6 oz/week    7 Standard  drinks or equivalent, 7 Glasses of wine, 7 Shots of liquor per week     Comment: daily liquor at night.  . Drug Use: No  . Sexual Activity: Yes   Other Topics Concern  . Not on file   Social History Narrative   The PMH, PSH, Social History, Family History, Medications, and allergies have been reviewed in [facility], and have been updated if relevant.   Review of Systems  Constitutional: Negative for fatigue.  Respiratory: Negative.   Cardiovascular: Negative.   Gastrointestinal: Negative.   Genitourinary: Negative.   Musculoskeletal: Negative.   Skin: Negative.   Allergic/Immunologic: Negative.   Neurological: Negative.   Hematological: Negative.   Psychiatric/Behavioral: Negative.   All other  systems reviewed and are negative.      Objective:    BP 102/70 mmHg  Pulse 85  Temp(Src) 98 F (36.7 C) (Oral)  Wt 114 lb (51.71 kg)  SpO2 95%  Wt Readings from Last 3 Encounters:  12/07/15 114 lb (51.71 kg)  12/04/15 112 lb 9.6 oz (51.075 kg)  11/17/15 116 lb (52.617 kg)    Physical Exam  Constitutional: She is oriented to person, place, and time. She appears well-developed.  thin  HENT:  Head: Normocephalic.  Eyes: Conjunctivae are normal.  Cardiovascular: Normal rate and regular rhythm.   Pulmonary/Chest: Effort normal and breath sounds normal.  Abdominal: Soft.  Neurological: She is alert and oriented to person, place, and time.  Skin: Skin is warm and dry.  Psychiatric: She has a normal mood and affect. Her behavior is normal. Thought content normal.  Nursing note and vitals reviewed.         Assessment & Plan:   Vitamin B12 deficiency - Plan: Vitamin B12  Other specified hypothyroidism - Plan: TSH, T4, Free  Alcohol abuse  B12 deficiency anemia No Follow-up on file.

## 2015-12-08 ENCOUNTER — Encounter: Payer: Self-pay | Admitting: *Deleted

## 2016-01-17 ENCOUNTER — Other Ambulatory Visit: Payer: Self-pay

## 2016-01-17 MED ORDER — LEVOTHYROXINE SODIUM 25 MCG PO TABS: 25.0000 ug | ORAL_TABLET | Freq: Every day | ORAL | 1 refills | Status: DC

## 2016-01-17 NOTE — Telephone Encounter (Signed)
Pt has one more levothyroxine to take and request refill to [facility] [address]. Pt seen 11/2015 and pt to f/u in 3 months. Refilled levothyroxine # 30 x 1 and pt will cb to schedule f/u appt before med is out. Pt voiced understanding.

## 2016-02-27 ENCOUNTER — Other Ambulatory Visit: Payer: Self-pay

## 2016-02-27 NOTE — Telephone Encounter (Signed)
To PCP

## 2016-02-27 NOTE — Telephone Encounter (Signed)
Pt left v/m requesting refill levothyroxine to [facility] [address]. Pt seen and thyroid labs done on 12/07/15.Pt was to f/u in 02/2016 but no future appt scheduled.last refilled # 30 x 1 on 01/17/16.  Pt is too busy now to schedule appt but pt request refill. Pt request cb when refill done.

## 2016-02-28 MED ORDER — LEVOTHYROXINE SODIUM 25 MCG PO TABS: 25.0000 ug | ORAL_TABLET | Freq: Every day | ORAL | 1 refills | Status: DC

## 2016-02-28 NOTE — Telephone Encounter (Signed)
Give 30 day supply and make follow up appt with PCP

## 2016-03-13 ENCOUNTER — Encounter: Payer: Self-pay | Admitting: Family Medicine

## 2016-03-13 ENCOUNTER — Ambulatory Visit (INDEPENDENT_AMBULATORY_CARE_PROVIDER_SITE_OTHER): Payer: BLUE CROSS/BLUE SHIELD | Admitting: Family Medicine

## 2016-03-13 VITALS — BP 106/54 | HR 72 | Temp 98.2°F | Wt 121.2 lb

## 2016-03-13 DIAGNOSIS — E038 Other specified hypothyroidism: Secondary | ICD-10-CM | POA: Diagnosis not present

## 2016-03-13 DIAGNOSIS — E039 Hypothyroidism, unspecified: Secondary | ICD-10-CM | POA: Diagnosis not present

## 2016-03-13 LAB — TSH: TSH: 3.3 u[IU]/mL (ref 0.35–4.50)

## 2016-03-13 LAB — T4, FREE: FREE T4: 0.75 ng/dL (ref 0.60–1.60)

## 2016-03-13 NOTE — Progress Notes (Signed)
Subjective:   Patient ID: Nicole MarekAmanda G [name], female    DOB: [birth year], 35 y.o.   MRN: [id number]  Nicole Marekmanda G [name] is a pleasant 35 y.o. year old female who presents to clinic today with Follow-up  on 03/13/2016  HPI:   Hypothyroid- currently taking synthroid 25 mcg daily.  Feels good.  Denies any symptoms of hypo or hyperthyroidism.   Current Outpatient Prescriptions on File Prior to Visit  Medication Sig Dispense Refill  . levothyroxine (SYNTHROID, LEVOTHROID) 25 MCG tablet Take 1 tablet (25 mcg total) by mouth daily before breakfast. 30 tablet 1  . Multiple Vitamin (MULTIVITAMIN WITH MINERALS) TABS tablet Take 1 tablet by mouth daily. 30 tablet 0   No current facility-administered medications on file prior to visit.     No Known Allergies  Past Medical History:  Diagnosis Date  . Anemia   . Dysrhythmia    Tacchy in HS.  Irregular with anemia.  Better now  . Hypothyroidism   . Kidney stones   . Migraines   . UTI (urinary tract infection)     Past Surgical History:  Procedure Laterality Date  . COLONOSCOPY WITH PROPOFOL N/A 11/17/2015   Procedure: COLONOSCOPY WITH PROPOFOL;  Surgeon: Midge Miniumarren Wohl, MD;  [facility];  Service: Endoscopy;  Laterality: N/A;  . WISDOM TOOTH EXTRACTION      Family History  Problem Relation Age of Onset  . Hypertension Mother   . Bladder Cancer Maternal Grandmother     Social History   Social History  . Marital status: Married    Spouse name: N/A  . Number of children: N/A  . Years of education: N/A   Occupational History  . Not on file.   Social History Main Topics  . Smoking status: Current Every Day Smoker    Packs/day: 1.00    Years: 20.00    Types: Cigarettes  . Smokeless tobacco: Never Used  . Alcohol use 12.6 oz/week    7 Standard drinks or equivalent, 7 Glasses of wine, 7 Shots of liquor per week     Comment: daily liquor at night.  . Drug use: No  . Sexual activity: Yes   Other Topics Concern    . Not on file   Social History Narrative  . No narrative on file   The PMH, PSH, Social History, Family History, Medications, and allergies [facility] been [facility], and have been updated if relevant.   Review of Systems  Constitutional: Negative.   HENT: Negative for trouble swallowing.   Respiratory: Negative.   Cardiovascular: Negative.   Endocrine: Negative.   Psychiatric/Behavioral: Negative.   All other systems reviewed and are negative.      Objective:    BP (!) 106/54   Pulse 72   Temp 98.2 F (36.8 C) (Oral)   Wt 121 lb 4 oz (55 kg)   LMP 02/24/2016   SpO2 98%   BMI 16.91 kg/m    Physical Exam  Constitutional: She is oriented to person, place, and time. She appears well-developed and well-nourished. No distress.  HENT:  Head: Normocephalic.  Eyes: Conjunctivae are normal.  Cardiovascular: Normal rate.   Pulmonary/Chest: Effort normal.  Musculoskeletal: Normal range of motion.  Neurological: She is alert and oriented to person, place, and time.  Skin: Skin is warm and dry. She is not diaphoretic.  Psychiatric: She has a normal mood and affect. Her behavior is normal. Thought content normal.  Vitals reviewed.  Assessment & Plan:   Other specified hypothyroidism No Follow-up on file.

## 2016-03-13 NOTE — Progress Notes (Signed)
Pre visit review using our clinic review tool, if applicable. No additional management support is needed unless otherwise documented below in the visit note. 

## 2016-03-13 NOTE — Assessment & Plan Note (Signed)
Repeat labs today If stable, continue current dose of synthroid, follow up labs (only) in 6 months.

## 2016-03-15 ENCOUNTER — Encounter: Payer: Self-pay | Admitting: *Deleted

## 2016-05-09 ENCOUNTER — Other Ambulatory Visit: Payer: Self-pay | Admitting: *Deleted

## 2016-05-09 MED ORDER — LEVOTHYROXINE SODIUM 25 MCG PO TABS: 25.0000 ug | ORAL_TABLET | Freq: Every day | ORAL | 1 refills | Status: DC

## 2016-06-11 ENCOUNTER — Telehealth: Payer: Self-pay

## 2016-06-11 NOTE — Telephone Encounter (Signed)
Lm on pts vm requesting a call back with update of status. Thyroid med was sent in for 6mos in Nov 2017, She may contact her pharmacy regarding refills

## 2016-06-11 NOTE — Telephone Encounter (Signed)
Please call to check on pt. 

## 2016-06-11 NOTE — Telephone Encounter (Signed)
PLEASE NOTE: All timestamps contained within this report are represented as Eastern Standard Time. CONFIDENTIALTY NOTICE: This fax transmission is intended only for the addressee. It contains information that is legally privileged, confidential or otherwise protected from use or disclosure. If you are not the intended recipient, you are strictly prohibited from reviewing, disclosing, copying using or disseminating any of this information or taking any action in reliance on or regarding this information. If you have received this fax in error, please notify us immediately by telephone so that we can arrange for its return to us. Phone: [phone], Toll-Free: [phone], Fax: [phone] Page: 1 of 4 Call Id: [id number] [facility] Night - Client >>>Contains Verbal Order - Signature Required<<< TELEPHONE ADVICE RECORD [facility] Patient Name: Nicole [name] Gender: Female DOB: Oct 15, 1980 Age: 35 Y 10 M 20 D Return Phone Number: [phone] (Primary) Address: City/State/Zip: [place] Client [facility] Night - Client Client Site [facility] - Night Physician Aron, Talia - MD Contact Type Call Who Is Calling Patient / Member / Family / Caregiver Call Type Triage / Clinical Relationship To Patient Self Return Phone Number [phone] (Primary) Chief Complaint Headache Reason for Call Symptomatic / Request for Health Information Initial Comment Caller states she has been without her prescription for her thyroid medicine. She has a severe headache. PreDisposition Did not know what to do Translation No Nurse Assessment Nurse: Bowers, RN, Rhonda Date/Time (Eastern Time): 06/10/2016 6:00:36 PM Confirm and document reason for call. If symptomatic, describe symptoms. ---Caller states she has been without her prescription for her thyroid medicine ran out last Wed or Thursday, She has a severe headache since  Friday. Does the patient have any new or worsening symptoms? ---Yes Will a triage be completed? ---Yes Related visit to physician within the last 2 weeks? ---No Does the PT have any chronic conditions? (i.e. diabetes, asthma, etc.) ---Yes List chronic conditions. ---hypothyroidism; Is the patient pregnant or possibly pregnant? (Ask all females between the ages of 12-55) ---No Is this a behavioral health or substance abuse call? ---No Nurse: Bowers, RN, Rhonda Date/Time (Eastern Time): 06/10/2016 6:09:09 PM Please select the assessment type ---Refill Additional Documentation ---needs thyroid med Does the patient have enough medication to last until the office opens? ---Unable to obtain loaner dose from Pharmacy Does the client directives allow for assistance with medications after hours? ---Yes Was the medication filled within the last 6 months? ---Yes PLEASE NOTE: All timestamps contained within this report are represented as Eastern Standard Time. CONFIDENTIALTY NOTICE: This fax transmission is intended only for the addressee. It contains information that is legally privileged, confidential or otherwise protected from use or disclosure. If you are not the intended recipient, you are strictly prohibited from reviewing, disclosing, copying using or disseminating any of this information or taking any action in reliance on or regarding this information. If you have received this fax in error, please notify us immediately by telephone so that we can arrange for its return to us. Phone: [phone], Toll-Free: [phone], Fax: [phone] Page: 2 of 4 Call Id: [id number] Nurse Assessment What is the name of the medication, dose and instructions as listed on the bottle? ---Levothyoxine <MEASUREM<MEASUREMEN<MEASUREMEN T> daily Name of the physician as listed on the bottle. ---Dr. Aaron Pharmacy name and phone number where most recently filled. ---[facility] ([address]) [phone] Additional Documentation ---Advised  that nurse will seek loaner dose and/or call MD for refill auth. Advised to call back for any new/worsening  symptoms. Guidelines Guideline Title Affirmed Question Affirmed Notes Nurse Date/Time (Eastern Time) Headache [1] MILD-MODERATE headache AND [2] present > 72 hours Odis LusterBowers, RN, Bjorn LoserRhonda 06/10/2016 6:04:38 PM Disp. Time Lamount Cohen(Eastern Time) Disposition Final User 06/10/2016 6:19:26 PM Called On-Call Provider Odis LusterBowers, RN, Bjorn Loserhonda 06/10/2016 6:20:48 PM Pharmacy Call Odis LusterBowers, RN, Bjorn Loserhonda [facility] [phone]: spoke with pharmacist and requested a loaner dose of Levothyroxine> unable to give loaner dose due to changing of Brands. 06/10/2016 6:23:12 PM Pharmacy Call Odis LusterBowers, RN, Bjorn Loserhonda [facility] [phone]: gave verbal order for Levothyroxine ([employer] brand), 06/10/2016 6:24:26 PM Call Completed Odis LusterBowers, RN, Bjorn LoserRhonda 06/10/2016 6:09:00 PM See PCP When Office is Open (within 3 days) Yes Odis LusterBowers, RN, Juliene Pinahonda Caller Understands: Yes Disagree/Comply: Comply Care Advice Given Per Guideline SEE PCP WITHIN 3 DAYS: * You need to be seen within 2 or 3 days. Call your doctor during regular office hours and make an appointment. An urgent care center is often the best source of care if your doctor's office is closed or you can't get an appointment. NOTE: If office will be open tomorrow, tell caller to call then, not in 3 days. PAIN MEDICINES: * For pain relief, take acetaminophen, ibuprofen, or naproxen. * Use the lowest amount that makes your pain feel better. IBUPROFEN (E.G., MOTRIN, ADVIL): * Take 400 mg (two 200 mg pills) by mouth every 6 hours as needed. * Another choice is to take 600 mg (three 200 mg pills) by mouth every 8 hours as needed. CAUTION - NSAIDS (E.G., IBUPROFEN, NAPROXEN): * Do not take NSAID medications for over 7 days without consulting your PCP. * You may take this medicine with or without food. Taking it with food or milk may lessen the chance the  drug will upset your stomach. REST: Lie down in a dark quiet place and relax until feeling better. LOCAL COLD: Apply a cold wet washcloth or cold pack to the forehead for 20 minutes STRETCHING: Stretch and massage any tight neck muscles. CALL BACK IF: * Severe headache persists over 2 hours after pain medicine * Stiff neck occurs (i.e., can't touch chin to chest) * You become worse. CARE ADVICE given per Headache (Adult) guideline. PLEASE NOTE: All timestamps contained within this report are represented as [place] Standard Time. CONFIDENTIALTY NOTICE: This fax transmission is intended only for the addressee. It contains information that is legally privileged, confidential or otherwise protected from use or disclosure. If you are not the intended recipient, you are strictly prohibited from reviewing, disclosing, copying using or disseminating any of this information or taking any action in reliance on or regarding this information. If you have received this fax in error, please notify us immediately by telephone so that we can arrange for its return to us. Phone: [phone], Toll-Free: [phone], Fax: [phone] Page: 3 of 4 Call Id: [id number] Verbal Orders/Maintenance Medications Medication Refill Route Dosage Regime Duration Admin Instructions User Name Levothyroxine Oral 25 mcg 3 Months Administer 1 tablet daily. #90 no reills. Odis LusterBowers, RN, Rhonda Referrals REFERRED TO PCP OFFICE Paging DoctorName Phone DateTime Result/Outcome Message Type Notes Hillard DankerCrawford, Elizabeth- MD [phone] 06/10/2016 6:19:26 PM Called On Call Provider - Reached Doctor Paged Hillard Dankerrawford, Elizabeth- MD 06/10/2016 6:20:02 PM Spoke with On Call - General Message Result VO obtained for Levothyroxine and to change brands as per pharmacy request. Caller informed. PLEASE NOTE: All timestamps contained within this report are represented as [place] Standard Time. CONFIDENTIALTY NOTICE: This fax transmission is  intended only for the addressee. It contains information that  is legally privileged, confidential or otherwise protected from use or disclosure. If you are not the intended recipient, you are strictly prohibited from reviewing, disclosing, copying using or disseminating any of this information or taking any action in reliance on or regarding this information. If you have received this fax in error, please notify us immediately by telephone so that we can arrange for its return to [place]. Phone: [phone], Toll-Free: [phone], Fax: [phone] Page: 4 of 4 Call Id: [id number] [facility] >>>Contains Verbal Order - Signature Required<<< [address] [place], [place] [address] [phone] Fax: [phone] MEDICATION ORDER [facility] Night - Client [facility] - Night Date: 06/10/2016 From: [facility] Department To: Ruthe Mannan - MD Please sign the order for the approved drug(s) given by our call center nurse on your behalf. Fax to [phone] within 5 business days. Thank you. Date Lamount Cohen Time): 06/10/2016 5:43:07 PM Triage RN: Marlyce Huge, RN NAME: Malvin Johns PHONE NUMBER: [phone] (Primary) BIRTHDATE: March 05, 1981 ADDRESS: CITY/STATE/ZIP: [place] CALLER: Self NAME: Rx Given Medication Refill Route Dosage Regime Duration Admin Instructions User Name Levothyroxine Oral 25 mcg 3 Months Administer 1 tablet daily. #90 no reills. Odis Luster, RN, Bjorn Loser MD Signature Date

## 2016-10-03 ENCOUNTER — Encounter: Payer: Self-pay | Admitting: Family Medicine

## 2016-10-03 ENCOUNTER — Encounter (INDEPENDENT_AMBULATORY_CARE_PROVIDER_SITE_OTHER): Payer: Self-pay

## 2016-10-03 ENCOUNTER — Ambulatory Visit (INDEPENDENT_AMBULATORY_CARE_PROVIDER_SITE_OTHER): Payer: BLUE CROSS/BLUE SHIELD | Admitting: Family Medicine

## 2016-10-03 VITALS — BP 100/60 | HR 96 | Temp 98.6°F | Wt 127.5 lb

## 2016-10-03 DIAGNOSIS — R21 Rash and other nonspecific skin eruption: Secondary | ICD-10-CM

## 2016-10-03 DIAGNOSIS — N3000 Acute cystitis without hematuria: Secondary | ICD-10-CM | POA: Diagnosis not present

## 2016-10-03 DIAGNOSIS — N3001 Acute cystitis with hematuria: Secondary | ICD-10-CM | POA: Insufficient documentation

## 2016-10-03 LAB — POC URINALSYSI DIPSTICK (AUTOMATED)
Bilirubin, UA: NEGATIVE
Glucose, UA: NEGATIVE
Ketones, UA: NEGATIVE
Nitrite, UA: NEGATIVE
PH UA: 6 (ref 5.0–8.0)
PROTEIN UA: NEGATIVE
UROBILINOGEN UA: 0.2 U/dL

## 2016-10-03 MED ORDER — TRIAMCINOLONE ACETONIDE 0.1 % EX CREA: 1.0000 "application " | TOPICAL_CREAM | Freq: Two times a day (BID) | CUTANEOUS | 0 refills | Status: DC

## 2016-10-03 MED ORDER — SULFAMETHOXAZOLE-TRIMETHOPRIM 800-160 MG PO TABS: 1.0000 | ORAL_TABLET | Freq: Two times a day (BID) | ORAL | 0 refills | Status: DC

## 2016-10-03 NOTE — Patient Instructions (Addendum)
Urinalysis checked today.  For rash - I think this is more of a contact dermatitis or possible eczema - treat with triamcinolone cream sent to pharmacy twice daily for 2 weeks then stop.  Take allegra once daily for itching as well.  If no improvement, let me know for oral steroid course.

## 2016-10-03 NOTE — Assessment & Plan Note (Signed)
Eczema vs contact dermatitis given pruritis. Localized spots - will treat with TCI cream 0.1% BID x 2 wks. Pt states good effect previously. Reviewed eczema care - regular moisturizing as well as avoiding hot showers.  Update if no better, consider oral prednisone.  Pt agrees with plan.

## 2016-10-03 NOTE — Assessment & Plan Note (Addendum)
Pt endorses UTI sxs without red flags.  UA shows LE and blood.  Will treat with bactrim DS 3d course. Update if persistent sxs.

## 2016-10-03 NOTE — Progress Notes (Signed)
Pre visit review using our clinic review tool, if applicable. No additional management support is needed unless otherwise documented below in the visit note. 

## 2016-10-03 NOTE — Progress Notes (Signed)
BP 100/60   Pulse 96   Temp 98.6 F (37 C) (Oral)   Wt 127 lb 8 oz (57.8 kg)   LMP 09/11/2016   BMI 17.78 kg/m    CC: rash Subjective:    Patient ID: Sarajane Marek, female    DOB: 1980/09/03, 36 y.o.   MRN: [id number]  HPI: KENDLE TURBIN is a 36 y.o. female presenting on 10/03/2016 for Rash (on back and legs)   New rash over last 1 month that started R lateral lower abd with spread to small of back, also with spots on lower abd and 2 spots on ankles. Very itchy, worse in hot bath. Spares face, extremities otherwise, palms/soles, other skin.   No similar rash in family members.  No new lotions, detergents, soaps or shampoos. No new foods. No new medicines/supplements.  She has used triamcinolone cream with good improvement.   h/o similar rash 3 yrs ago prior to marriage - thought ringworm due to tanning bed use, didn't improve with antifungal. Did improve with TCI cream at that time.   Hypothyroidism on levothyroxine. She did have change in formulation 06/2016 but doesn't think this is related.   Oh by the way - increased urgency, frequency, urinary discomfort. No blood in urine. No recent antibiotics either. No recent UTI.   Relevant past medical, surgical, family and social history reviewed and updated as indicated. Interim medical history since our last visit reviewed. Allergies and medications reviewed and updated. Outpatient Medications Prior to Visit  Medication Sig Dispense Refill  . levothyroxine (SYNTHROID, LEVOTHROID) 25 MCG tablet Take 1 tablet (25 mcg total) by mouth daily before breakfast. 90 tablet 1  . Multiple Vitamin (MULTIVITAMIN WITH MINERALS) TABS tablet Take 1 tablet by mouth daily. 30 tablet 0   No facility-administered medications prior to visit.      Per HPI unless specifically indicated in ROS section below Review of Systems     Objective:    BP 100/60   Pulse 96   Temp 98.6 F (37 C) (Oral)   Wt 127 lb 8 oz (57.8 kg)   LMP 09/11/2016    BMI 17.78 kg/m   Wt Readings from Last 3 Encounters:  10/03/16 127 lb 8 oz (57.8 kg)  03/13/16 121 lb 4 oz (55 kg)  12/07/15 114 lb (51.7 kg)    Physical Exam  Constitutional: She appears well-developed and well-nourished. No distress.  Skin: Skin is warm and dry. Rash noted. There is erythema.  Pruritic erythematous scaly patches on skin R lateral lower abdomen, small of back, and bilateral medial ankles  Nursing note and vitals reviewed.  Results for orders placed or performed in visit on 10/03/16  POCT Urinalysis Dipstick (Automated)  Result Value Ref Range   Color, UA Yellow    Clarity, UA Hazy    Glucose, UA Negative    Bilirubin, UA Negative    Ketones, UA Negative    Spec Grav, UA >=1.030 (A) 1.010 - 1.025   Blood, UA 1+    pH, UA 6.0 5.0 - 8.0   Protein, UA Negative    Urobilinogen, UA 0.2 0.2 or 1.0 E.U./dL   Nitrite, UA Negative    Leukocytes, UA Small (1+) (A) Negative      Assessment & Plan:   Problem List Items Addressed This Visit    Acute cystitis    Pt endorses UTI sxs without red flags.  UA shows LE and blood.  Will treat with bactrim DS 3d course. Update  if persistent sxs.       Relevant Orders   POCT Urinalysis Dipstick (Automated) (Completed)   Skin rash - Primary    Eczema vs contact dermatitis given pruritis. Localized spots - will treat with TCI cream 0.1% BID x 2 wks. Pt states good effect previously. Reviewed eczema care - regular moisturizing as well as avoiding hot showers.  Update if no better, consider oral prednisone.  Pt agrees with plan.           Follow up plan: Return if symptoms worsen or fail to improve.  Eustaquio Boyden, MD

## 2016-10-26 ENCOUNTER — Emergency Department
Admission: EM | Admit: 2016-10-26 | Discharge: 2016-10-26 | Disposition: A | Discharge status: Home / Self Care | Payer: BLUE CROSS/BLUE SHIELD | Attending: Emergency Medicine | Admitting: Emergency Medicine

## 2016-10-26 ENCOUNTER — Encounter: Payer: Self-pay | Admitting: Emergency Medicine

## 2016-10-26 DIAGNOSIS — E039 Hypothyroidism, unspecified: Secondary | ICD-10-CM | POA: Diagnosis not present

## 2016-10-26 DIAGNOSIS — L237 Allergic contact dermatitis due to plants, except food: Secondary | ICD-10-CM | POA: Insufficient documentation

## 2016-10-26 DIAGNOSIS — F17210 Nicotine dependence, cigarettes, uncomplicated: Secondary | ICD-10-CM | POA: Insufficient documentation

## 2016-10-26 DIAGNOSIS — Z79899 Other long term (current) drug therapy: Secondary | ICD-10-CM | POA: Insufficient documentation

## 2016-10-26 DIAGNOSIS — R21 Rash and other nonspecific skin eruption: Secondary | ICD-10-CM | POA: Diagnosis not present

## 2016-10-26 MED ORDER — PREDNISONE 10 MG PO TABS: 10.0000 mg | ORAL_TABLET | Freq: Every day | ORAL | 0 refills | Status: DC

## 2016-10-26 MED ORDER — TRIAMCINOLONE ACETONIDE 40 MG/ML IJ SUSP
40.0000 mg | Freq: Once | INTRAMUSCULAR | Status: AC
  Administered 2016-10-26: 40 mg via INTRAMUSCULAR
  Filled 2016-10-26: qty 1

## 2016-10-26 NOTE — ED Provider Notes (Signed)
[facility] Emergency Department Provider Note  ____________________________________________  Time seen: Approximately 4:10 PM  I have reviewed the triage vital signs and the nursing notes.   HISTORY  Chief Complaint Rash    HPI Nicole [name] is a 36 y.o. female presenting to the emergency department with a diffuse, linear, pruritic generalized rash after working outside clearing brush from her house. Patient states that she has tried calamine lotion which has not relieved her symptoms. Patient denies facial swelling, nausea, vomiting or shortness of breath. Patient denies recent illness. Patient denies a history of diabetes.   Past Medical History:  Diagnosis Date  . Anemia   . Dysrhythmia    Tacchy in HS.  Irregular with anemia.  Better now  . Hypothyroidism   . Kidney stones   . Migraines   . UTI (urinary tract infection)     Patient Active Problem List   Diagnosis Date Noted  . Acute cystitis 10/03/2016  . B12 deficiency anemia 12/04/2015  . Noninfectious diarrhea   . Vitamin B12 deficiency 10/25/2015  . Hypothyroidism 10/25/2015  . Alcohol abuse 10/25/2015  . Palpitations 10/12/2015  . Nonspecific abnormal electrocardiogram (ECG) (EKG) 10/12/2015  . Symptomatic anemia 10/12/2015  . Diarrhea 11/02/2014  . Screening for HIV (human immunodeficiency virus) 11/02/2014  . Screening for STD (sexually transmitted disease) 11/02/2014  . Skin rash 06/01/2014  . Tobacco abuse 06/01/2014  . History of nephrolithiasis 06/01/2014  . Low serum vitamin D 06/01/2014    Past Surgical History:  Procedure Laterality Date  . COLONOSCOPY WITH PROPOFOL N/A 11/17/2015   Procedure: COLONOSCOPY WITH PROPOFOL;  Surgeon: Midge Minium, MD;  Location: [facility];  Service: Endoscopy;  Laterality: N/A;  . WISDOM TOOTH EXTRACTION      Prior to Admission medications   Medication Sig Start Date End Date Taking? Authorizing Provider  levothyroxine  (SYNTHROID, LEVOTHROID) 25 MCG tablet Take 1 tablet (25 mcg total) by mouth daily before breakfast. 05/09/16   Dianne Dun, MD  Multiple Vitamin (MULTIVITAMIN WITH MINERALS) TABS tablet Take 1 tablet by mouth daily. 10/13/15   Enedina Finner, MD  predniSONE (DELTASONE) 10 MG tablet Take 1 tablet (10 mg total) by mouth daily. Take 60 mg by mouth the first week. Take 30 mg by mouth the second week. Take 15 mg by mouth the third week. 10/26/16 11/16/16  Orvil Feil, PA-C  sulfamethoxazole-trimethoprim (BACTRIM DS,SEPTRA DS) 800-160 MG tablet Take 1 tablet by mouth 2 (two) times daily. 10/03/16   Eustaquio Boyden, MD  triamcinolone cream (KENALOG) 0.1 % Apply 1 application topically 2 (two) times daily. Apply to AA. 10/03/16 10/03/17  Eustaquio Boyden, MD    Allergies Patient has no known allergies.  Family History  Problem Relation Age of Onset  . Hypertension Mother   . Bladder Cancer Maternal Grandmother     Social History Social History  Substance Use Topics  . Smoking status: Current Every Day Smoker    Packs/day: 1.00    Years: 20.00    Types: Cigarettes  . Smokeless tobacco: Never Used  . Alcohol use 12.6 oz/week    7 Standard drinks or equivalent, 7 Glasses of wine, 7 Shots of liquor per week     Comment: daily liquor at night.     Review of Systems  Constitutional: No fever/chills Eyes: No visual changes. No discharge ENT: No upper respiratory complaints. Cardiovascular: no chest pain. Respiratory: no cough. No SOB. Gastrointestinal: No abdominal pain.  No nausea, no vomiting.  No diarrhea.  No constipation. Musculoskeletal: Negative for musculoskeletal pain. Skin: Patient has rash. Neurological: Negative for headaches, focal weakness or numbness.  ____________________________________________   PHYSICAL EXAM:  VITAL SIGNS: ED Triage Vitals  Enc Vitals Group     BP 10/26/16 1506 96/61     Pulse Rate 10/26/16 1506 87     Resp 10/26/16 1506 18     Temp 10/26/16  1506 97.6 F (36.4 C)     Temp Source 10/26/16 1506 Oral     SpO2 10/26/16 1506 100 %     Weight 10/26/16 1507 125 lb (56.7 kg)     Height 10/26/16 1507 5\' 11"  (1.803 m)     Head Circumference --      Peak Flow --      Pain Score 10/26/16 1506 9     Pain Loc --      Pain Edu? --      Excl. in GC? --      Constitutional: Alert and oriented. Well appearing and in no acute distress. Eyes: Conjunctivae are normal. PERRL. EOMI. Head: Atraumatic. ENT:      Ears: Tympanic membranes are pearly bilaterally with no involvement of rash along the external auditory canal.      Nose: No congestion/rhinnorhea.      Mouth/Throat: Mucous membranes are moist.  Cardiovascular: Normal rate, regular rhythm. Normal S1 and S2.  Good peripheral circulation. Respiratory: Normal respiratory effort without tachypnea or retractions. Lungs CTAB. Good air entry to the bases with no decreased or absent breath sounds. Gastrointestinal: Bowel sounds 4 quadrants. Soft and nontender to palpation. No guarding or rigidity. No palpable masses. No distention. No CVA tenderness. Skin: Patient has diffuse linear rash Psychiatric: Mood and affect are normal. Speech and behavior are normal. Patient exhibits appropriate insight and judgement.   ____________________________________________   LABS (all labs ordered are listed, but only abnormal results are displayed)  Labs Reviewed - No data to display ____________________________________________  EKG   ____________________________________________  RADIOLOGY   No results found.  ____________________________________________    PROCEDURES  Procedure(s) performed:    Procedures    Medications  triamcinolone acetonide (KENALOG-40) injection 40 mg (not administered)     ____________________________________________   INITIAL IMPRESSION / ASSESSMENT AND PLAN / ED COURSE  Pertinent labs & imaging results that were available during my care of the  patient were reviewed by me and considered in my medical decision making (see chart for details).  Review of the [place] CSRS was performed in accordance of the [facility] prior to dispensing any controlled drugs.     Assessment and plan: Poison ivy dermatitis Patient presents to the emergency department with a diffuse, pruritic linear rash. On physical exam patient had no facial edema. Patient denies shortness of breath, nausea and vomiting. An injection of Kenalog was given in the emergency department. Patient was discharged with a 21 day course of tapered prednisone. Patient education was provided regarding the risk of rebound rash if adherence to prednisone taper is compromised. Patient voiced understanding. Vital signs are reassuring at this time. Patient was advised to follow-up with her PCP in one week. All patient questions were answered.   ____________________________________________  FINAL CLINICAL IMPRESSION(S) / ED DIAGNOSES  Final diagnoses:  Poison ivy      NEW MEDICATIONS STARTED DURING THIS VISIT:  New Prescriptions   PREDNISONE (DELTASONE) 10 MG TABLET    Take 1 tablet (10 mg total) by mouth daily. Take 60 mg by mouth the first week. Take  30 mg by mouth the second week. Take 15 mg by mouth the third week.        This chart was dictated using voice recognition software/Dragon. Despite best efforts to proofread, errors can occur which can change the meaning. Any change was purely unintentional.    Orvil Feil, PA-C 10/26/16 1626    Phineas Semen, MD 10/26/16 [phone]

## 2016-10-26 NOTE — ED Notes (Signed)
While this RN at bedside, pt asked to go outside and come right back, this RN explained that patient's weren't supposed to leave a patient care area and that she would be on a med hold approx 15-20 mins to monitor for reaction to IM injection. Pt states understanding, while this RN at the nurse's station, pt visualized leaving the patient care area with her husband prior to receiving D/C instructions.

## 2016-10-26 NOTE — ED Triage Notes (Signed)
Rash noted x 3 days. Thinks is poison ivy.

## 2016-10-29 ENCOUNTER — Telehealth: Payer: Self-pay | Admitting: Family Medicine

## 2016-10-29 NOTE — Telephone Encounter (Signed)
I spoke with pt and advised that Dr Dayton MartesAron was out of office today and will send note to another physician but it may be 10/30/16 when receive reply. I asked pt who has previously given her prednisone for poison ivy. Pt said no one had given her prednisone; I asked if she had been seen for poison ivy at another time; pt said no. I advised on med list rx for prednisone was printed by a PA; pt said oh I went to ED on 10/26/16 and was given a shot and could not wait any longer for the rx and left without getting prednisone rx. Presently pt has 10- 15 % coverage of poison ivy on legs and just a few spots on arms. I advised if our office has not prescribed prednisone before then would need to be seen; pt said she cannot because she is working. Advised to go to UC and she said she cannot because she is working. Offered pt an appt on another day and pt said no she will be working. Advised pt she may could get rx from ED from 10/26/16. Pt said she would wait to here back from our office.

## 2016-10-29 NOTE — Telephone Encounter (Signed)
I will defer to PCP on this.

## 2016-10-29 NOTE — Telephone Encounter (Signed)
[facility] - Client TELEPHONE ADVICE RECORD   [facility]     Patient Name: Nicole [name] Cullens Initial Comment Caller states believes she spoke to a nurse this morning, bad case of poison ivy and was hoping that a steroid would be called in.   DOB: [birth year]      Nurse Assessment  Nurse: Harlon FlorWhitaker, RN, Darl PikesSusan Date/Time (Eastern Time): 10/29/2016 3:40:11 PM  Confirm and document reason for call. If symptomatic, describe symptoms. ---Caller states believes she spoke to a nurse this morning, bad case of poison ivy and was hoping that a steroid would be called in. she has rash on thighs and forearms. and some on her face. There is a clear fluid . This is day # 6 . The rash is tiny dot on her chin . no fever  Does the patient have any new or worsening symptoms? ---Yes  Will a triage be completed? ---Yes  Related visit to physician within the last 2 weeks? ---No  Does the PT have any chronic conditions? (i.e. diabetes, asthma, etc.) ---Yes  List chronic conditions. ---LMP 10/09/16 Thyroid  Is the patient pregnant or possibly pregnant? (Ask all females between the ages of 3512-55) ---No  Is this a behavioral health or substance abuse call? ---No    Guidelines     Guideline Title Affirmed Question Affirmed Notes   Poison Ivy - Oak - [place]ac [1] Increasing redness around poison ivy AND [2] larger than 2 inches (5 cm)    Final Disposition User   See Physician within 4 Hours (or PCP triage) Harlon FlorWhitaker, RN, Bearl MulberrySusan   Comments   Spoke with office triage nurse Levada SchillingSummers that the record has been sent to the PCP at 11 am and the PCP has not had time to review the request. If pt is uncomfortable she would need to be seen in an UC as per triage outcome. Caller verbalized understanding.   Referrals   REFERRED TO PCP OFFICE   Disagree/Comply: Comply

## 2016-10-29 NOTE — Telephone Encounter (Signed)
Patient Name: Nicole JohnsMANDA [name]  DOB: 03/20/1981    Initial Comment Caller states that she has poison ivy and can't come in for an appointment.   Nurse Assessment  Nurse: Stefano GaulStringer, RN, Dwana CurdVera Date/Time (Eastern Time): 10/29/2016 10:40:33 AM  Confirm and document reason for call. If symptomatic, describe symptoms. ---Caller states she has poison ivy and can not come in appt. Has had poison ivy before. Her boss won't let her off to come in for an appt. Has poison ivy on arms and legs. No fever. Has taken steroids before when she has had poison ivy.  Does the patient have any new or worsening symptoms? ---Yes  Will a triage be completed? ---Yes  Related visit to physician within the last 2 weeks? ---No  Does the PT have any chronic conditions? (i.e. diabetes, asthma, etc.) ---No  Is the patient pregnant or possibly pregnant? (Ask all females between the ages of 4212-55) ---No  Is this a behavioral health or substance abuse call? ---No     Guidelines    Guideline Title Affirmed Question Affirmed Notes  Poison Lajoyce Cornersvy - Oak - [place] Severe poison ivy, oak, or sumac reaction in the past    Final Disposition User   See Physician within 24 Hours EndwellStringer, [name], Dwana CurdVera    Comments  pt states she can not come in for appt as her boss won't let her off work. She would like some steroids called in. Please call pt back and let her know about steroids.   Referrals  GO TO FACILITY REFUSED   Disagree/Comply: Disagree  Disagree/Comply Reason: Disagree with instructions

## 2016-10-29 NOTE — Telephone Encounter (Signed)
[facility] calling back; pt is on phone; advised note was sent to PCP at 11:11 today but PCP has not addressed yet; [facility] nurse advised this morning that pt was to go to UC but pt declined;TH nurse will let pt know note has been sent and waiting on reply from physician. If pt needs to be seen right away pt should go to UC. TH voiced understanding and will relay to pt.

## 2016-10-30 ENCOUNTER — Other Ambulatory Visit: Payer: Self-pay | Admitting: Family Medicine

## 2016-10-30 MED ORDER — PREDNISONE 10 MG PO TABS: 10.0000 mg | ORAL_TABLET | Freq: Every day | ORAL | 0 refills | Status: AC

## 2016-10-30 MED ORDER — PREDNISONE 10 MG PO TABS: 10.0000 mg | ORAL_TABLET | Freq: Every day | ORAL | 0 refills | Status: DC

## 2016-10-30 NOTE — Telephone Encounter (Signed)
Given the situation, I have refilled the rx for prednisone that is on her med list, prescribed by the ER.  We really need to see patients before prescribing this in the future but she was assessed by an ER provider.  Please let pt know.

## 2016-10-30 NOTE — Telephone Encounter (Signed)
Left refill on voice mail at pharmacy  

## 2016-10-30 NOTE — Telephone Encounter (Signed)
Spoke to pt. She said thanks a lot!!

## 2016-12-26 ENCOUNTER — Other Ambulatory Visit: Payer: Self-pay | Admitting: Family Medicine

## 2017-01-30 ENCOUNTER — Other Ambulatory Visit: Payer: Self-pay | Admitting: Family Medicine

## 2017-01-30 DIAGNOSIS — E038 Other specified hypothyroidism: Secondary | ICD-10-CM

## 2017-01-30 DIAGNOSIS — D518 Other vitamin B12 deficiency anemias: Secondary | ICD-10-CM

## 2017-01-30 DIAGNOSIS — R7989 Other specified abnormal findings of blood chemistry: Secondary | ICD-10-CM

## 2017-01-30 DIAGNOSIS — D649 Anemia, unspecified: Secondary | ICD-10-CM

## 2017-01-31 ENCOUNTER — Other Ambulatory Visit (INDEPENDENT_AMBULATORY_CARE_PROVIDER_SITE_OTHER): Payer: BLUE CROSS/BLUE SHIELD

## 2017-01-31 DIAGNOSIS — D518 Other vitamin B12 deficiency anemias: Secondary | ICD-10-CM

## 2017-01-31 DIAGNOSIS — D649 Anemia, unspecified: Secondary | ICD-10-CM | POA: Diagnosis not present

## 2017-01-31 DIAGNOSIS — E038 Other specified hypothyroidism: Secondary | ICD-10-CM | POA: Diagnosis not present

## 2017-01-31 LAB — CBC WITH DIFFERENTIAL/PLATELET
BASOS ABS: 0 10*3/uL (ref 0.0–0.1)
Basophils Relative: 0.2 % (ref 0.0–3.0)
EOS PCT: 1.9 % (ref 0.0–5.0)
Eosinophils Absolute: 0.1 10*3/uL (ref 0.0–0.7)
HEMATOCRIT: 43.2 % (ref 36.0–46.0)
HEMOGLOBIN: 14.1 g/dL (ref 12.0–15.0)
LYMPHS ABS: 2.8 10*3/uL (ref 0.7–4.0)
LYMPHS PCT: 42.3 % (ref 12.0–46.0)
MCHC: 32.6 g/dL (ref 30.0–36.0)
MCV: 98.2 fl (ref 78.0–100.0)
MONOS PCT: 7.9 % (ref 3.0–12.0)
Monocytes Absolute: 0.5 10*3/uL (ref 0.1–1.0)
Neutro Abs: 3.2 10*3/uL (ref 1.4–7.7)
Neutrophils Relative %: 47.7 % (ref 43.0–77.0)
Platelets: 273 10*3/uL (ref 150.0–400.0)
RBC: 4.39 Mil/uL (ref 3.87–5.11)
RDW: 13.8 % (ref 11.5–15.5)
WBC: 6.7 10*3/uL (ref 4.0–10.5)

## 2017-01-31 LAB — TSH: TSH: 2.87 u[IU]/mL (ref 0.35–4.50)

## 2017-01-31 LAB — T4, FREE: FREE T4: 0.91 ng/dL (ref 0.60–1.60)

## 2017-01-31 LAB — VITAMIN B12: Vitamin B-12: 1166 pg/mL — ABNORMAL HIGH (ref 211–911)

## 2017-01-31 LAB — T3, FREE: T3 FREE: 3.1 pg/mL (ref 2.3–4.2)

## 2017-02-04 ENCOUNTER — Other Ambulatory Visit: Payer: Self-pay | Admitting: Family Medicine

## 2017-02-04 NOTE — Telephone Encounter (Signed)
Pt left /vm requesting 6 month refill levothyroxine. Pt had thyroid lab on 01/31/17; pt has not been seen for thyroid since 03/13/16; is it OK to refill.

## 2017-05-26 ENCOUNTER — Telehealth: Payer: Self-pay

## 2017-05-26 NOTE — Telephone Encounter (Signed)
Patient Name: Nicole JohnsMANDA [name] Gender: Female DOB: 28-Apr-1981 Age: 36 Y 10 M 4 D Return Phone Number: [phone] (Primary) Address: [place]Zip[place] [id number] Client [facility] Night Physician Nicole BoydenGutierrez, Nicole - MD Contact Type Call Who Is Calling Patient / Member / Family / Caregiver Call Type Triage / Clinical Relationship To Patient Self Return Phone Number [phone] (Primary) Chief Complaint Urination Pain Reason for Call Symptomatic / Request for Health Information Initial Comment Caller states that she has symptoms of a UTI Translation No Nurse Assessment Nurse: Nicole SaundersGerard, RN, Nicole [name] Date/Time Lamount Cohen(Eastern Time): 05/25/2017 2:04:28 PM Confirm and document reason for call. If symptomatic, describe symptoms. ---Caller reports she has need to urinate frequently, only a few gtts come out. Has been taking AZO x 2 days. No fever. Has hx of UTI's Has had 3-4 this year. Symptoms started 2-3 days ago Does the patient have any new or worsening symptoms? ---Yes Will a triage be completed? ---Yes Related visit to physician within the last 2 weeks? ---No Does the PT have any chronic conditions? (i.e. diabetes, asthma, etc.) ---Yes List chronic conditions. ---Takes Thyroid medication and is almost Out Her PCP has left and would like to see Dr Nicole HonesGutierrez. Is the patient pregnant or possibly pregnant? (Ask all females between the ages of 5112-55) ---No Is this a behavioral health or substance abuse call? ---No Guidelines Guideline Title Affirmed Question Affirmed Notes Nurse Date/Time (Eastern Time) Urination Pain - Female > 2 UTI's in last year Nicole SpittleGerard, RN, Nicole 05/25/2017 2:07:30 PM Disp. Time Lamount Cohen(Eastern Time) Disposition Final User 05/25/2017 2:19:29 PM See Physician within 24 Hours Yes Nicole SaundersGerard, RN, Nicole HollingsheadJane Caller Disagree/Comply Comply Caller Understands Yes PLEASE NOTE: All timestamps contained  within this report are represented as [place] Standard Time. CONFIDENTIALTY NOTICE: This fax transmission is intended only for the addressee. It contains information that is legally privileged, confidential or otherwise protected from use or disclosure. If you are not the intended recipient, you are strictly prohibited from reviewing, disclosing, copying using or disseminating any of this information or taking any action in reliance on or regarding this information. If you have received this fax in error, please notify us immediately by telephone so that we can arrange for its return to us. Phone: [phone], Toll-Free: [phone], Fax: [phone] Page: 2 of 2 Call Id: [id number] PreDisposition Call Doctor Care Advice Given Per Guideline SEE PHYSICIAN WITHIN 24 HOURS: CRANBERRY JUICE: * Increased fluid intake may be contraindicated in adults with renal failure or heart failure. CAUTION - FLUIDS: * Do not drink more than 16 oz (480 ml) of cranberry juice cocktail per day (Reason: too much cranberry juice can also be irritating to the bladder). CAUTION - CRANBERRY JUICE: * You become worse. CALL BACK IF: CARE ADVICE given per Urination Pain - Female (Adult) guideline. After Care Instructions Given Call Event Type User Date / Time Description Education document email Nicole SpittleGerard, RN, Nicole 05/25/2017 2:17:14 PM [facility] Instructions Comments User: Harrington ChallengerJane, Gerard, RN Date/Time Lamount Cohen(Eastern Time): 05/25/2017 2:20:10 PM Emailed Telehealth instructions Referrals GO TO FACILITY UNDECIDED GO TO FACILITY UNDECIDED

## 2017-05-26 NOTE — Telephone Encounter (Signed)
Patient has appointment tomorrow (05/27/17) with PCP.

## 2017-05-27 ENCOUNTER — Ambulatory Visit: Payer: BLUE CROSS/BLUE SHIELD | Admitting: Family Medicine

## 2017-05-30 ENCOUNTER — Ambulatory Visit: Payer: BLUE CROSS/BLUE SHIELD | Admitting: Family Medicine

## 2017-05-30 ENCOUNTER — Encounter: Payer: Self-pay | Admitting: Family Medicine

## 2017-05-30 VITALS — BP 120/64 | HR 89 | Temp 97.9°F | Wt 129.0 lb

## 2017-05-30 DIAGNOSIS — N3001 Acute cystitis with hematuria: Secondary | ICD-10-CM

## 2017-05-30 DIAGNOSIS — E038 Other specified hypothyroidism: Secondary | ICD-10-CM | POA: Diagnosis not present

## 2017-05-30 LAB — POC URINALSYSI DIPSTICK (AUTOMATED)
Nitrite, UA: POSITIVE
SPEC GRAV UA: 1.025 (ref 1.010–1.025)
pH, UA: 5 (ref 5.0–8.0)

## 2017-05-30 MED ORDER — SULFAMETHOXAZOLE-TRIMETHOPRIM 800-160 MG PO TABS: 1.0000 | ORAL_TABLET | Freq: Two times a day (BID) | ORAL | 0 refills | Status: DC

## 2017-05-30 MED ORDER — LEVOTHYROXINE SODIUM 25 MCG PO TABS: ORAL_TABLET | ORAL | 0 refills | Status: DC

## 2017-05-30 NOTE — Assessment & Plan Note (Signed)
Refilled levothyroxine, discussed correct method of administration.

## 2017-05-30 NOTE — Patient Instructions (Addendum)
Treat UTI with 5 d course of bactrim.  Let us know if not improved with treatment.   Urinary Tract Infection, Adult A urinary tract infection (UTI) is an infection of any part of the urinary tract. The urinary tract includes the:  Kidneys.  Ureters.  Bladder.  Urethra.  These organs make, store, and get rid of pee (urine) in the body. Follow these instructions at home:  Take over-the-counter and prescription medicines only as told by your doctor.  If you were prescribed an antibiotic medicine, take it as told by your doctor. Do not stop taking the antibiotic even if you start to feel better.  Avoid the following drinks: ? Alcohol. ? Caffeine. ? Tea. ? Carbonated drinks.  Drink enough fluid to keep your pee clear or pale yellow.  Keep all follow-up visits as told by your doctor. This is important.  Make sure to: ? Empty your bladder often and completely. Do not to hold pee for long periods of time. ? Empty your bladder before and after sex. ? Wipe from front to back after a bowel movement if you are female. Use each tissue one time when you wipe. Contact a doctor if:  You have back pain.  You have a fever.  You feel sick to your stomach (nauseous).  You throw up (vomit).  Your symptoms do not get better after 3 days.  Your symptoms go away and then come back. Get help right away if:  You have very bad back pain.  You have very bad lower belly (abdominal) pain.  You are throwing up and cannot keep down any medicines or water. This information is not intended to replace advice given to you by your health care provider. Make sure you discuss any questions you have with your health care provider. Document Released: 11/27/2007 Document Revised: 11/16/2015 Document Reviewed: 05/01/2015 Elsevier Interactive Patient Education  [employer]2018 Elsevier Inc.

## 2017-05-30 NOTE — Assessment & Plan Note (Addendum)
Rpt UTI, last 09/2016. Treat with 5 d bactrim course. Preventative measures reviewed. Update if not improved with treatment.  Consider rpt UA after treatment to ensure hematuria resolution.

## 2017-05-30 NOTE — Progress Notes (Signed)
BP 120/64 (BP Location: Left Arm, Patient Position: Sitting, Cuff Size: Normal)   Pulse 89   Temp 97.9 F (36.6 C) (Oral)   Wt 129 lb (58.5 kg)   LMP 05/14/2017   SpO2 98%   BMI 17.99 kg/m    CC: UTI? Subjective:    Patient ID: Nicole [name], female    DOB: 21-Mar-1981, 36 y.o.   MRN: [id number]  HPI: Nicole [name] is a 36 y.o. female presenting on 05/30/2017 for Urinary Frequency and Dysuria (Started 05/24/17. Taken AZO)   6d h/o UTI sxs. Trouble getting into office due to hectic work scheduled. endorses dysuria, urgency, frequency. No hematuria, abd pain, flank pain, nausea/vomiting, fevers/chills. Treating with azo daily over the past 5 days.   Infrequent UTIs - last 09/2016 treated with 3d bactrim course.  H/o kidney stones, stent placement during pregnancy.   Plans to establish with Debbie - requests thyroid refill while she gets in. Will schedule CPE  Relevant past medical, surgical, family and social history reviewed and updated as indicated. Interim medical history since our last visit reviewed. Allergies and medications reviewed and updated. Outpatient Medications Prior to Visit  Medication Sig Dispense Refill  . Cyanocobalamin (VITAMIN B-12 PO) Take by mouth daily.    Marland Kitchen. FOLIC ACID PO Take by mouth daily.    . Multiple Vitamin (MULTIVITAMIN WITH MINERALS) TABS tablet Take 1 tablet by mouth daily. 30 tablet 0  . levothyroxine (SYNTHROID, LEVOTHROID) 25 MCG tablet TAKE 1 TABLET BY MOUTH ONCE DAILY BEFORE BREAKFAST (NEED  LABS  FOR  FURTHER  REFILLS) 30 tablet 5  . sulfamethoxazole-trimethoprim (BACTRIM DS,SEPTRA DS) 800-160 MG tablet Take 1 tablet by mouth 2 (two) times daily. 6 tablet 0  . triamcinolone cream (KENALOG) 0.1 % Apply 1 application topically 2 (two) times daily. Apply to AA. 45 g 0   No facility-administered medications prior to visit.      Per HPI unless specifically indicated in ROS section below Review of Systems     Objective:    BP 120/64  (BP Location: Left Arm, Patient Position: Sitting, Cuff Size: Normal)   Pulse 89   Temp 97.9 F (36.6 C) (Oral)   Wt 129 lb (58.5 kg)   LMP 05/14/2017   SpO2 98%   BMI 17.99 kg/m   Wt Readings from Last 3 Encounters:  05/30/17 129 lb (58.5 kg)  10/26/16 125 lb (56.7 kg)  10/03/16 127 lb 8 oz (57.8 kg)    Physical Exam  Constitutional: She appears well-developed and well-nourished. No distress.  Abdominal: Normal appearance and bowel sounds are normal. She exhibits no distension and no mass. There is no tenderness. There is no rigidity, no rebound, no guarding, no CVA tenderness and negative Murphy's sign.  Musculoskeletal: She exhibits no edema.  Psychiatric: She has a normal mood and affect.  Nursing note and vitals reviewed.  Lab Results  Component Value Date   TSH 2.87 01/31/2017   Results for orders placed or performed in visit on 05/30/17  POCT Urinalysis Dipstick (Automated)  Result Value Ref Range   Color, UA amber    Clarity, UA clear    Glucose, UA 2+    Bilirubin, UA 3+    Ketones, UA 1+    Spec Grav, UA 1.025 1.010 - 1.025   Blood, UA 1+    pH, UA 5.0 5.0 - 8.0   Protein, UA trace    Urobilinogen, UA >=8.0 (A) 0.2 or 1.0 E.U./dL   Nitrite, UA  positive    Leukocytes, UA Large (3+) (A) Negative    Micro: On AZO WBC few RBC 5-10 Bact tr Casts none Epi 10-20 UCx not sent    Assessment & Plan:   Problem List Items Addressed This Visit    Acute cystitis with hematuria - Primary    Rpt UTI, last 09/2016. Treat with 5 d bactrim course. Preventative measures reviewed. Update if not improved with treatment.  Consider rpt UA after treatment to ensure hematuria resolution.       Relevant Orders   POCT Urinalysis Dipstick (Automated) (Completed)   Hypothyroidism    Refilled levothyroxine, discussed correct method of administration.       Relevant Medications   levothyroxine (SYNTHROID, LEVOTHROID) 25 MCG tablet       Follow up plan: Return if  symptoms worsen or fail to improve.  Eustaquio BoydenJavier Brittini Brubeck, MD

## 2017-07-04 ENCOUNTER — Encounter: Payer: Self-pay | Admitting: Family Medicine

## 2017-07-04 ENCOUNTER — Encounter (INDEPENDENT_AMBULATORY_CARE_PROVIDER_SITE_OTHER): Payer: Self-pay

## 2017-07-04 ENCOUNTER — Ambulatory Visit: Payer: BLUE CROSS/BLUE SHIELD | Admitting: Family Medicine

## 2017-07-04 VITALS — BP 100/70 | HR 87 | Temp 98.1°F | Wt 131.0 lb

## 2017-07-04 DIAGNOSIS — R350 Frequency of micturition: Secondary | ICD-10-CM | POA: Diagnosis not present

## 2017-07-04 DIAGNOSIS — R3129 Other microscopic hematuria: Secondary | ICD-10-CM

## 2017-07-04 DIAGNOSIS — N3090 Cystitis, unspecified without hematuria: Secondary | ICD-10-CM | POA: Diagnosis not present

## 2017-07-04 LAB — POC URINALSYSI DIPSTICK (AUTOMATED)
Blood, UA: 10
Nitrite, UA: POSITIVE
PROTEIN UA: 15
Urobilinogen, UA: >=8 E.U./dL — AB
pH, UA: 5 (ref 5.0–8.0)

## 2017-07-04 MED ORDER — NITROFURANTOIN MONOHYD MACRO 100 MG PO CAPS: 100.0000 mg | ORAL_CAPSULE | Freq: Two times a day (BID) | ORAL | 0 refills | Status: DC

## 2017-07-04 NOTE — Progress Notes (Signed)
Subjective:    Patient ID: Nicole [name], female    DOB: May 18, 1981, 37 y.o.   MRN: [id number]  HPI This is a 37 yo female who presents today to establish care and for urinary symptoms. She was previously cared for by Dr. Dayton MartesAron. Last pap 5/16.   She was seen 05/30/17 for UTI, was treated with Bactim DS BID for 5 days. Has history of kidney stones many years ago, no recent symptoms of stones. No fever, no nausea or vomiting. Has had 1-2 UTI in last year. Works from home, doesn't always urinate frequently. Good water intake. Smoker.     Past Medical History:  Diagnosis Date  . Anemia   . Dysrhythmia    [place] in HS.  Irregular with anemia.  Better now  . Hypothyroidism   . Kidney stones   . Migraines   . UTI (urinary tract infection)    Past Surgical History:  Procedure Laterality Date  . COLONOSCOPY WITH PROPOFOL N/A 11/17/2015   Procedure: COLONOSCOPY WITH PROPOFOL;  Surgeon: Midge Miniumarren Wohl, MD;  [facility];  Service: Endoscopy;  Laterality: N/A;  . WISDOM TOOTH EXTRACTION     Family History  Problem Relation Age of Onset  . Hypertension Mother   . Bladder Cancer Maternal Grandmother    Social History   Tobacco Use  . Smoking status: Current Every Day Smoker    Packs/day: 1.00    Years: 20.00    Pack years: 20.00    Types: Cigarettes  . Smokeless tobacco: Never Used  Substance Use Topics  . Alcohol use: Yes    Alcohol/week: 12.6 oz    Types: 7 Standard drinks or equivalent, 7 Glasses of wine, 7 Shots of liquor per week    Comment: daily liquor at night.  . Drug use: No      Review of Systems Per HPI    Objective:   Physical Exam Physical Exam  Constitutional: She is oriented to person, place, and time. She appears well-developed and well-nourished. No distress.  HENT:  Head: Normocephalic and atraumatic.  Cardiovascular: Normal rate, regular rhythm and normal heart sounds.   Pulmonary/Chest: Effort normal and breath sounds normal.    Abdominal: Soft. She exhibits no distension. There is no tenderness. There is no rebound, no guarding and no CVA tenderness.  Neurological: She is alert and oriented to person, place, and time.  Skin: Skin is warm and dry. She is not diaphoretic.  Psychiatric: She has a normal mood and affect. Her behavior is normal. Judgment and thought content normal.  Vitals reviewed.  BP 100/70 (BP Location: Left Arm, Patient Position: Sitting, Cuff Size: Normal)   Pulse 87   Temp 98.1 F (36.7 C) (Oral)   Wt 131 lb (59.4 kg)   SpO2 98%   BMI 18.27 kg/m  Wt Readings from Last 3 Encounters:  07/04/17 131 lb (59.4 kg)  05/30/17 129 lb (58.5 kg)  10/26/16 125 lb (56.7 kg)   Results for orders placed or performed in visit on 07/04/17  POCT Urinalysis Dipstick (Automated)  Result Value Ref Range   Color, UA DARK ORANGE from AZO    Clarity, UA clear    Glucose, UA 1+    Bilirubin, UA 3+    Ketones, UA 1+    Spec Grav, UA >=1.030 (A) 1.010 - 1.025   Blood, UA 10    pH, UA 5.0 5.0 - 8.0   Protein, UA 15    Urobilinogen, UA >=8.0 (A) 0.2  or 1.0 E.U./dL   Nitrite, UA positive    Leukocytes, UA Large (3+) (A) Negative       Assessment & Plan:  1. Urinary frequency - POCT Urinalysis Dipstick (Automated) - Urine Culture  2. Cystitis - Provided written and verbal information regarding diagnosis and treatment. - RTC precautions reviewed - nitrofurantoin, macrocrystal-monohydrate, (MACROBID) 100 MG capsule; Take 1 capsule (100 mg total) by mouth 2 (two) times daily.  Dispense: 14 capsule; Refill: 0  3. Other microscopic hematuria - will recheck at CPE   - follow up for CPE 5/19  Olean Ree, FNP-BC  [facility], [facility]  07/04/2017 3:06 PM

## 2017-07-04 NOTE — Patient Instructions (Signed)
I will notify you of culture results  Please schedule well woman visit for May  Let me know when you need thyroid medication refilled  Urinary Tract Infection, Adult A urinary tract infection (UTI) is an infection of any part of the urinary tract. The urinary tract includes the:  Kidneys.  Ureters.  Bladder.  Urethra.  These organs make, store, and get rid of pee (urine) in the body. Follow these instructions at home:  Take over-the-counter and prescription medicines only as told by your doctor.  If you were prescribed an antibiotic medicine, take it as told by your doctor. Do not stop taking the antibiotic even if you start to feel better.  Avoid the following drinks: ? Alcohol. ? Caffeine. ? Tea. ? Carbonated drinks.  Drink enough fluid to keep your pee clear or pale yellow.  Keep all follow-up visits as told by your doctor. This is important.  Make sure to: ? Empty your bladder often and completely. Do not to hold pee for long periods of time. ? Empty your bladder before and after sex. ? Wipe from front to back after a bowel movement if you are female. Use each tissue one time when you wipe. Contact a doctor if:  You have back pain.  You have a fever.  You feel sick to your stomach (nauseous).  You throw up (vomit).  Your symptoms do not get better after 3 days.  Your symptoms go away and then come back. Get help right away if:  You have very bad back pain.  You have very bad lower belly (abdominal) pain.  You are throwing up and cannot keep down any medicines or water. This information is not intended to replace advice given to you by your health care provider. Make sure you discuss any questions you have with your health care provider. Document Released: 11/27/2007 Document Revised: 11/16/2015 Document Reviewed: 05/01/2015 Elsevier Interactive Patient Education  [employer]2018 [employer] Inc.

## 2017-07-05 LAB — URINE CULTURE
MICRO NUMBER:: 90046809
SPECIMEN QUALITY:: ADEQUATE

## 2017-08-04 ENCOUNTER — Encounter: Payer: Self-pay | Admitting: *Deleted

## 2017-08-04 NOTE — Telephone Encounter (Signed)
error    This encounter was created in error - please disregard.

## 2017-08-05 ENCOUNTER — Encounter: Payer: Self-pay | Admitting: Internal Medicine

## 2017-08-05 ENCOUNTER — Ambulatory Visit: Payer: BLUE CROSS/BLUE SHIELD | Admitting: Internal Medicine

## 2017-08-05 VITALS — BP 102/68 | HR 90 | Temp 98.4°F | Wt 134.0 lb

## 2017-08-05 DIAGNOSIS — L210 Seborrhea capitis: Secondary | ICD-10-CM

## 2017-08-05 DIAGNOSIS — L299 Pruritus, unspecified: Secondary | ICD-10-CM

## 2017-08-05 DIAGNOSIS — L309 Dermatitis, unspecified: Secondary | ICD-10-CM

## 2017-08-05 MED ORDER — PREDNISONE 10 MG PO TABS: ORAL_TABLET | ORAL | 0 refills | Status: DC

## 2017-08-05 NOTE — Progress Notes (Signed)
Subjective:    Patient ID: Nicole [name], female    DOB: 1980/09/21, 37 y.o.   MRN: [id number]  HPI  Pt presents to the clinic today with c/o a itching. She reports this started 10 days ago. It started on her scalp and spread to her chest, abdomen, backs, arms and legs. It is everywhere except on her hands and feet. There is no rash present. She has tried Benadryl and Cocoa Butter with minimal relief. She denies changes in soaps, lotion or detergents. She denies changes in medication or diet. No one in her home has similar issues.   Review of Systems  Past Medical History:  Diagnosis Date  . Anemia   . Dysrhythmia    Tacchy in HS.  Irregular with anemia.  Better now  . Hypothyroidism   . Kidney stones   . Migraines   . UTI (urinary tract infection)     Current Outpatient Medications  Medication Sig Dispense Refill  . Cyanocobalamin (VITAMIN B-12 PO) Take by mouth daily.    Marland Kitchen. FOLIC ACID PO Take by mouth daily.    Marland Kitchen. levothyroxine (SYNTHROID, LEVOTHROID) 25 MCG tablet TAKE 1 TABLET BY MOUTH ONCE DAILY BEFORE BREAKFAST 90 tablet 0  . Multiple Vitamin (MULTIVITAMIN WITH MINERALS) TABS tablet Take 1 tablet by mouth daily. 30 tablet 0   No current facility-administered medications for this visit.     No Known Allergies  Family History  Problem Relation Age of Onset  . Hypertension Mother   . Bladder Cancer Maternal Grandmother     Social History   Socioeconomic History  . Marital status: Married    Spouse name: Not on file  . Number of children: Not on file  . Years of education: Not on file  . Highest education level: Not on file  Social Needs  . Financial resource strain: Not on file  . Food insecurity - worry: Not on file  . Food insecurity - inability: Not on file  . Transportation needs - medical: Not on file  . Transportation needs - non-medical: Not on file  Occupational History  . Not on file  Tobacco Use  . Smoking status: Current Every Day Smoker   Packs/day: 1.00    Years: 20.00    Pack years: 20.00    Types: Cigarettes  . Smokeless tobacco: Never Used  Substance and Sexual Activity  . Alcohol use: Yes    Alcohol/week: 12.6 oz    Types: 7 Standard drinks or equivalent, 7 Glasses of wine, 7 Shots of liquor per week    Comment: daily liquor at night.  . Drug use: No  . Sexual activity: Yes  Other Topics Concern  . Not on file  Social History Narrative  . Not on file     Constitutional: Denies fever, malaise, fatigue, headache or abrupt weight changes.  Skin: Pt reports generalized itching. Denies redness, rashes, lesions or ulcercations.    No other specific complaints in a complete review of systems (except as listed in HPI above).     Objective:   Physical Exam   BP 102/68   Pulse 90   Temp 98.4 F (36.9 C) (Oral)   Wt 134 lb (60.8 kg)   LMP 07/14/2017   SpO2 99%   BMI 18.69 kg/m  Wt Readings from Last 3 Encounters:  08/05/17 134 lb (60.8 kg)  07/04/17 131 lb (59.4 kg)  05/30/17 129 lb (58.5 kg)    General: Appears his stated age, well developed, well  nourished in NAD. Skin: Extremely dry scalp/dandruff. Excoriation noted on bilateral forearms. No rashes noted.  BMET    Component Value Date/Time   NA 139 12/04/2015 1100   K 3.1 (L) 12/04/2015 1100   CL 105 12/04/2015 1100   CO2 26 12/04/2015 1100   GLUCOSE 113 (H) 12/04/2015 1100   BUN 7 12/04/2015 1100   CREATININE 0.73 12/04/2015 1100   CALCIUM 8.4 (L) 12/04/2015 1100   GFRNONAA >60 12/04/2015 1100   GFRAA >60 12/04/2015 1100    Lipid Panel     Component Value Date/Time   CHOL 156 11/02/2014 1140   TRIG 376.0 (H) 11/02/2014 1140   HDL 64.40 11/02/2014 1140   CHOLHDL 2 11/02/2014 1140   VLDL 75.2 (H) 11/02/2014 1140    CBC    Component Value Date/Time   WBC 6.7 01/31/2017 1506   RBC 4.39 01/31/2017 1506   HGB 14.1 01/31/2017 1506   HCT 43.2 01/31/2017 1506   PLT 273.0 01/31/2017 1506   MCV 98.2 01/31/2017 1506   MCH 32.5  12/04/2015 1100   MCHC 32.6 01/31/2017 1506   RDW 13.8 01/31/2017 1506   LYMPHSABS 2.8 01/31/2017 1506   MONOABS 0.5 01/31/2017 1506   EOSABS 0.1 01/31/2017 1506   BASOSABS 0.0 01/31/2017 1506    Hgb A1C Lab Results  Component Value Date   HGBA1C 5.4 10/13/2015           Assessment & Plan:   Eczema of Scalp/Dandruff:  Try Selsun Blue or Head and Shoulders Wash hair as infrequently as possible Use lukewarm water instead of hot water  Itching:  No rash noted Will try Pred Taper x 6 days Start Zyrtec daily x 2 weeks  If no improvement, consider sending to allergy  Return precautions discussed Nicki Reaper, NP

## 2017-08-05 NOTE — Patient Instructions (Signed)

## 2017-09-29 ENCOUNTER — Other Ambulatory Visit: Payer: Self-pay | Admitting: Family Medicine

## 2017-09-29 NOTE — Telephone Encounter (Signed)
Copied from CRM [id number]. Topic: Quick Communication - Rx Refill/Question >> Sep 29, 2017  4:21 PM Floria RavelingStovall, Shana A wrote: Medication: levothyroxine (SYNTHROID, LEVOTHROID) 25 MCG tablet [[id number]][[id number]] Has the patient contacted their pharmacy? No  (Agent: If no, request that the patient contact the pharmacy for the refill.) Preferred Pharmacy (with phone number or street name): [facility] on [address]  Agent: Please be advised that RX refills may take up to 3 business days. We ask that you follow-up with your pharmacy.

## 2017-09-30 NOTE — Telephone Encounter (Signed)
Rx refill request: levothyroxine 25 mcg  LOV: 08/05/17 acute- 11/07/17- CPE scheduled  PCP: Leone PayorGessner  Pharmacy: verified

## 2017-10-22 ENCOUNTER — Other Ambulatory Visit: Payer: Self-pay | Admitting: Family Medicine

## 2017-10-22 DIAGNOSIS — E039 Hypothyroidism, unspecified: Secondary | ICD-10-CM

## 2017-10-22 DIAGNOSIS — R3129 Other microscopic hematuria: Secondary | ICD-10-CM

## 2017-10-22 DIAGNOSIS — Z862 Personal history of diseases of the blood and blood-forming organs and certain disorders involving the immune mechanism: Secondary | ICD-10-CM

## 2017-10-31 ENCOUNTER — Other Ambulatory Visit (INDEPENDENT_AMBULATORY_CARE_PROVIDER_SITE_OTHER): Payer: BLUE CROSS/BLUE SHIELD

## 2017-10-31 DIAGNOSIS — E039 Hypothyroidism, unspecified: Secondary | ICD-10-CM

## 2017-10-31 DIAGNOSIS — R3129 Other microscopic hematuria: Secondary | ICD-10-CM

## 2017-10-31 DIAGNOSIS — Z862 Personal history of diseases of the blood and blood-forming organs and certain disorders involving the immune mechanism: Secondary | ICD-10-CM | POA: Diagnosis not present

## 2017-10-31 LAB — CBC WITH DIFFERENTIAL/PLATELET
BASOS PCT: 0.5 % (ref 0.0–3.0)
Basophils Absolute: 0 10*3/uL (ref 0.0–0.1)
EOS PCT: 3.7 % (ref 0.0–5.0)
Eosinophils Absolute: 0.2 10*3/uL (ref 0.0–0.7)
HCT: 42.4 % (ref 36.0–46.0)
HEMOGLOBIN: 14 g/dL (ref 12.0–15.0)
Lymphocytes Relative: 43.2 % (ref 12.0–46.0)
Lymphs Abs: 2.2 10*3/uL (ref 0.7–4.0)
MCHC: 33.1 g/dL (ref 30.0–36.0)
MCV: 94.8 fl (ref 78.0–100.0)
MONOS PCT: 9.6 % (ref 3.0–12.0)
Monocytes Absolute: 0.5 10*3/uL (ref 0.1–1.0)
NEUTROS ABS: 2.2 10*3/uL (ref 1.4–7.7)
Neutrophils Relative %: 43 % (ref 43.0–77.0)
Platelets: 174 10*3/uL (ref 150.0–400.0)
RBC: 4.47 Mil/uL (ref 3.87–5.11)
RDW: 16.9 % — AB (ref 11.5–15.5)
WBC: 5.2 10*3/uL (ref 4.0–10.5)

## 2017-10-31 LAB — COMPREHENSIVE METABOLIC PANEL
ALT: 21 U/L (ref 0–35)
AST: 42 U/L — ABNORMAL HIGH (ref 0–37)
Albumin: 3.8 g/dL (ref 3.5–5.2)
Alkaline Phosphatase: 57 U/L (ref 39–117)
BUN: 8 mg/dL (ref 6–23)
CO2: 26 meq/L (ref 19–32)
Calcium: 8.7 mg/dL (ref 8.4–10.5)
Chloride: 104 mEq/L (ref 96–112)
Creatinine, Ser: 0.7 mg/dL (ref 0.40–1.20)
GFR: 98.87 mL/min (ref >60.00)
Glucose, Bld: 94 mg/dL (ref 70–99)
POTASSIUM: 3.6 meq/L (ref 3.5–5.1)
Sodium: 142 mEq/L (ref 135–145)
Total Bilirubin: 0.4 mg/dL (ref 0.2–1.2)
Total Protein: 7 g/dL (ref 6.0–8.3)

## 2017-10-31 LAB — URINALYSIS, MICROSCOPIC ONLY
RBC / HPF: NONE SEEN (ref 0–?)
WBC, UA: NONE SEEN (ref 0–?)

## 2017-10-31 LAB — TSH: TSH: 5.16 u[IU]/mL — ABNORMAL HIGH (ref 0.35–4.50)

## 2017-11-07 ENCOUNTER — Other Ambulatory Visit (HOSPITAL_COMMUNITY)
Admission: RE | Admit: 2017-11-07 | Discharge: 2017-11-07 | Disposition: A | Discharge status: Home / Self Care | Payer: BLUE CROSS/BLUE SHIELD | Source: Ambulatory Visit | Attending: Family Medicine | Admitting: Family Medicine

## 2017-11-07 ENCOUNTER — Encounter: Payer: Self-pay | Admitting: Family Medicine

## 2017-11-07 ENCOUNTER — Ambulatory Visit (INDEPENDENT_AMBULATORY_CARE_PROVIDER_SITE_OTHER): Payer: BLUE CROSS/BLUE SHIELD | Admitting: Family Medicine

## 2017-11-07 VITALS — BP 100/64 | HR 96 | Temp 98.3°F | Ht 69.75 in | Wt 129.0 lb

## 2017-11-07 DIAGNOSIS — E039 Hypothyroidism, unspecified: Secondary | ICD-10-CM

## 2017-11-07 DIAGNOSIS — L249 Irritant contact dermatitis, unspecified cause: Secondary | ICD-10-CM

## 2017-11-07 DIAGNOSIS — Z23 Encounter for immunization: Secondary | ICD-10-CM

## 2017-11-07 DIAGNOSIS — Z0000 Encounter for general adult medical examination without abnormal findings: Secondary | ICD-10-CM | POA: Diagnosis not present

## 2017-11-07 DIAGNOSIS — Z124 Encounter for screening for malignant neoplasm of cervix: Secondary | ICD-10-CM | POA: Diagnosis not present

## 2017-11-07 DIAGNOSIS — L309 Dermatitis, unspecified: Secondary | ICD-10-CM

## 2017-11-07 DIAGNOSIS — Z720 Tobacco use: Secondary | ICD-10-CM

## 2017-11-07 MED ORDER — KETOCONAZOLE 2 % EX SHAM: 1.0000 "application " | MEDICATED_SHAMPOO | CUTANEOUS | 0 refills | Status: DC

## 2017-11-07 MED ORDER — TRIAMCINOLONE ACETONIDE 0.1 % EX LOTN: 1.0000 "application " | TOPICAL_LOTION | Freq: Two times a day (BID) | CUTANEOUS | 0 refills | Status: DC

## 2017-11-07 MED ORDER — LEVOTHYROXINE SODIUM 50 MCG PO TABS: ORAL_TABLET | ORAL | 1 refills | Status: DC

## 2017-11-07 NOTE — Patient Instructions (Addendum)
Good to see you today  Take two of your levothyroxine until you run out, then take 1 of new dose  Schedule lab only visit for 3 months   Keeping You Healthy  Get These Tests 1. Blood Pressure- Have your blood pressure checked once a year by your health care provider.  Normal blood pressure is 120/80. 2. Weight- Have your body mass index (BMI) calculated to screen for obesity.  BMI is measure of body fat based on height and weight.  You can also calculate your own BMI at https://www.west-esparza.com/. 3. Cholesterol- Have your cholesterol checked every 5 years starting at age 27 then yearly starting at age 65. 4. Chlamydia, HIV, and other sexually transmitted diseases- Get screened every year until age 47, then within three months of each new sexual provider. 5. Pap Test - Every 1-5 years; discuss with your health care provider. 6. Mammogram- Every 1-2 years starting at age 85--50  Take these medicines  Calcium with Vitamin D-Your body needs 1200 mg of Calcium each day and [phone] IU of Vitamin D daily.  Your body can only absorb 500 mg of Calcium at a time so Calcium must be taken in 2 or 3 divided doses throughout the day.  Multivitamin with folic acid- Once daily if it is possible for you to become pregnant.  Get these Immunizations  Gardasil-Series of three doses; prevents HPV related illness such as genital warts and cervical cancer.  Menactra-Single dose; prevents meningitis.  Tetanus shot- Every 10 years.  Flu shot-Every year.  Take these steps 1. Do not smoke-Your healthcare provider can help you quit.  For tips on how to quit go to www.smokefree.gov or call 1-800 QUITNOW. 2. Be physically active- Exercise 5 days a week for at least 30 minutes.  If you are not already physically active, start slow and gradually work up to 30 minutes of moderate physical activity.  Examples of moderate activity include walking briskly, dancing, swimming, bicycling, etc. 3. Breast Cancer- A self  breast exam every month is important for early detection of breast cancer.  For more information and instruction on self breast exams, ask your healthcare provider or [place]. 4. Eat a healthy diet- Eat a variety of healthy foods such as fruits, vegetables, whole grains, low fat milk, low fat cheeses, yogurt, lean meats, poultry and fish, beans, nuts, tofu, etc.  For more information go to www. Thenutritionsource.org 5. Drink alcohol in moderation- Limit alcohol intake to one drink or less per day. Never drink and drive. 6. Depression- Your emotional health is as important as your physical health.  If you're feeling down or losing interest in things you normally enjoy please talk to your healthcare provider about being screened for depression. 7. Dental visit- Brush and floss your teeth twice daily; visit your dentist twice a year. 8. Eye doctor- Get an eye exam at least every 2 years. 9. Helmet use- Always wear a helmet when riding a bicycle, motorcycle, rollerblading or skateboarding. 10. Safe sex- If you may be exposed to sexually transmitted infections, use a condom. 11. Seat belts- Seat belts can save your live; always wear one. 12. Smoke/Carbon Monoxide detectors- These detectors need to be installed on the appropriate level of your home. Replace batteries at least once a year. 13. Skin cancer- When out in the sun please cover up and use sunscreen 15 SPF or higher. 14. Violence- If anyone is threatening or hurting you, please tell your healthcare provider.

## 2017-11-07 NOTE — Progress Notes (Signed)
Subjective:    Patient ID: Nicole [name], female    DOB: 05-08-1981, 37 y.o.   MRN: [id number]  HPI This is a 37 yo female who presents today for CPE. Enjoys decorating. Has been doing well.    Last CPE- 11/02/14 Mammo- NA Pap- 5/16 Colonoscopy- 11/17/15 Tdap- overdue, will have today Eye- a little over a year ago Dental- regular Exercise- yard work Stress- takes bath nightly  Past Medical History:  Diagnosis Date  . Anemia   . Dysrhythmia    Tacchy in HS.  Irregular with anemia.  Better now  . Hypothyroidism   . Kidney stones   . Migraines   . UTI (urinary tract infection)    Past Surgical History:  Procedure Laterality Date  . COLONOSCOPY WITH PROPOFOL N/A 11/17/2015   Procedure: COLONOSCOPY WITH PROPOFOL;  Surgeon: Midge Minium, MD;  Location: [facility];  Service: Endoscopy;  Laterality: N/A;  . WISDOM TOOTH EXTRACTION     Family History  Problem Relation Age of Onset  . Hypertension Mother   . Bladder Cancer Maternal Grandmother    Social History   Tobacco Use  . Smoking status: Current Every Day Smoker    Packs/day: 1.00    Years: 20.00    Pack years: 20.00    Types: Cigarettes  . Smokeless tobacco: Never Used  Substance Use Topics  . Alcohol use: Yes    Alcohol/week: 12.6 oz    Types: 7 Standard drinks or equivalent, 7 Glasses of wine, 7 Shots of liquor per week    Comment: daily liquor at night.  . Drug use: No     Review of Systems  Constitutional: Positive for fatigue.  HENT: Positive for rhinorrhea (with recent pollen, improved).   Eyes: Negative.   Respiratory: Negative.   Cardiovascular: Negative.   Gastrointestinal: Negative.   Endocrine: Negative.   Genitourinary: Negative.   Musculoskeletal: Negative.   Skin: Positive for rash (scalp, itchy legs. Has tried Selsium blue, Head anc Shoulders. ).  Allergic/Immunologic: Positive for environmental allergies.  Neurological: Negative.        Objective:   Physical  Exam Physical Exam  Constitutional: She is oriented to person, place, and time. She appears well-developed and well-nourished. No distress.  HENT:  Head: Normocephalic and atraumatic.  Right Ear: External ear normal.  Left Ear: External ear normal.  Nose: Nose normal.  Mouth/Throat: Oropharynx is clear and moist. No oropharyngeal exudate.  Eyes: Conjunctivae are normal. Pupils are equal, round, and reactive to light.  Neck: Normal range of motion. Neck supple. No JVD present. No thyromegaly present.  Cardiovascular: Normal rate, regular rhythm, normal heart sounds and intact distal pulses.   Pulmonary/Chest: Effort normal and breath sounds normal. Right breast exhibits no inverted nipple, no mass, no nipple discharge, no skin change and no tenderness. Left breast exhibits no inverted nipple, no mass, no nipple discharge, no skin change and no tenderness. Breasts are symmetrical.  Abdominal: Soft. Bowel sounds are normal. She exhibits no distension and no mass. There is no tenderness. There is no rebound and no guarding.  Genitourinary: Vagina normal. Pelvic exam was performed with patient supine. There is no rash, tenderness, lesion or injury on the right labia. There is no rash, tenderness, lesion or injury on the left labia. Cervix exhibits no motion tenderness and no discharge. No vaginal discharge found.  Musculoskeletal: Normal range of motion. She exhibits no edema or tenderness.  Lymphadenopathy:    She has no cervical adenopathy.  Neurological:  She is alert and oriented to person, place, and time. She has normal reflexes.  Skin: Skin is warm and dry. She is not diaphoretic. Scalp with patchy flaky areas, mild erythema. Bilateral legs with scattered raised erythematous papules.  Psychiatric: She has a normal mood and affect. Her behavior is normal. Judgment and thought content normal.  Vitals reviewed.     BP 100/64 (BP Location: Right Arm, Patient Position: Sitting, Cuff Size:  Normal)   Pulse 96   Temp 98.3 F (36.8 C) (Oral)   Ht 5' 9.75" (1.772 m)   Wt 129 lb (58.5 kg)   LMP 10/24/2017   SpO2 97%   BMI 18.64 kg/m  Wt Readings from Last 3 Encounters:  11/07/17 129 lb (58.5 kg)  08/05/17 134 lb (60.8 kg)  07/04/17 131 lb (59.4 kg)   Depression screen [facility] 2/9 11/07/2017 11/02/2014  Decreased Interest 0 0  Down, Depressed, Hopeless 0 0  PHQ - 2 Score 0 0       Assessment & Plan:  1. Annual physical exam - Discussed and encouraged healthy lifestyle choices- adequate sleep, regular exercise, stress management and healthy food choices.  - reviewed labs   2. Hypothyroidism, unspecified type - recheck TSH 3 months - levothyroxine (SYNTHROID, LEVOTHROID) 50 MCG tablet; TAKE 1 TABLET BY MOUTH ONCE DAILY BEFORE BREAKFAST  Dispense: 90 tablet; Refill: 1 - TSH; Future  3. Eczema of scalp - ketoconazole (NIZORAL) 2 % shampoo; Apply 1 application topically 2 (two) times a week.  Dispense: 120 mL; Refill: 0  4. Irritant contact dermatitis, unspecified trigger - triamcinolone lotion (KENALOG) 0.1 %; Apply 1 application topically 2 (two) times daily. For no more than 10 days  Dispense: 60 mL; Refill: 0  5. Need for Tdap vaccination - Tdap vaccine greater than or equal to 7yo IM  6. Screening for cervical cancer - Cytology - PAP()  7. Tobacco abuse - patient not interested in quitting at this time, encouraged her to contemplate cessation  Olean Ree, FNP-BC  [facility], [facility]  11/12/2017 9:39 AM

## 2017-11-11 LAB — CYTOLOGY - PAP
Bacterial vaginitis: POSITIVE — AB
Candida vaginitis: NEGATIVE
Diagnosis: NEGATIVE
HPV: NOT DETECTED

## 2017-11-12 ENCOUNTER — Encounter: Payer: Self-pay | Admitting: Family Medicine

## 2017-11-12 ENCOUNTER — Other Ambulatory Visit: Payer: Self-pay | Admitting: Family Medicine

## 2017-11-12 DIAGNOSIS — B9689 Other specified bacterial agents as the cause of diseases classified elsewhere: Secondary | ICD-10-CM

## 2017-11-12 DIAGNOSIS — N760 Acute vaginitis: Principal | ICD-10-CM

## 2017-11-12 MED ORDER — METRONIDAZOLE 0.75 % VA GEL: 1.0000 | Freq: Every day | VAGINAL | 0 refills | Status: DC

## 2017-11-18 ENCOUNTER — Telehealth: Payer: Self-pay | Admitting: Family Medicine

## 2017-11-18 NOTE — Telephone Encounter (Signed)
Pt is calling back, Please call [phone]

## 2017-11-18 NOTE — Telephone Encounter (Signed)
Please notify patient that the metronidazole vaginal gel is the best treatment for bacterial vaginosis. She can use this medication but will need to stop drinking alcohol while using, also avoid alcohol 24 hours after her last dose.

## 2017-11-18 NOTE — Telephone Encounter (Signed)
Spoke with Dr. Ermalene Searing who researched and recommended the following:  There are no other medications to treat bacterial vaginosis that does not carry the same potential interactions with alcohol use. And, if patient is going to continue drinking then the topical gel is less likely to be a problem.  In addition, the potential effects are not life threatening and typically limited to GI upset, nausea/vomitting.    It is ultimately patient's decision whether she will or can stop drinking to decrease risks and increase safely with taking the medication as there are no other alternatives to treatment.  Patient thanks me for the information and will decrease alcohol and try the gel as prescribed.

## 2017-11-18 NOTE — Telephone Encounter (Signed)
PLEASE NOTE: All timestamps contained within this report are represented as [place] Standard Time. CONFIDENTIALTY NOTICE: This fax transmission is intended only for the addressee. It contains information that is legally privileged, confidential or otherwise protected from use or disclosure. If you are not the intended recipient, you are strictly prohibited from reviewing, disclosing, copying using or disseminating any of this information or taking any action in reliance on or regarding this information. If you have received this fax in error, please notify us immediately by telephone so that we can arrange for its return to [place]. Phone: [phone], Toll-Free: [phone], Fax: [phone] Page: 1 of 1 Call Id: [id number] [facility] Night - Client TELEPHONE ADVICE RECORD [facility] Patient Name: Nicole [name] Gender: Female DOB: [birth year] Age: 37 Y 3 M 29 D Return Phone Number: [phone] (Primary) Address[place] [address] Client [facility] Night - Client Client Site [facility] - Night Physician Deboraha Sprang- NP Contact Type Call Who Is Calling Patient / Member / Family / Caregiver Call Type Triage / Clinical Relationship To Patient Self Return Phone Number [phone] (Primary) Chief Complaint Vaginal Discharge Reason for Call Symptomatic / Request for Health Information Initial Comment Caller states she was called in Metronidazole Gel for a vaginal infection and the pharmacist told her she can't drink alcohol while taking this medication and she drinks so she would like something else. Translation No Nurse Assessment Nurse: Andee Poles, RN, Philis Pique Date/Time (Eastern Time): 11/17/2017 12:58:12 PM Confirm and document reason for call. If symptomatic, describe symptoms. ---Caller states that the pharmacist warned against drinking alcohol with the medication prescribed  and is looking for an alternative medication. Caller states that her symptoms are not getting worse. Does the patient have any new or worsening symptoms? ---No Please document clinical information provided and list any resource used. ---Nurse asked that caller connect with office in the morning if looking for alternative medication. Guidelines Guideline Title Affirmed Question Affirmed Notes Nurse Date/Time (Eastern Time) Disp. Time Lamount Cohen Time) Disposition Final User 11/17/2017 12:36:18 PM Send To Nurse Jerelene Redden, RN, Marylene Land 11/17/2017 1:02:44 PM Clinical Call Yes Humphrey, RN, Philis Pique

## 2017-11-18 NOTE — Telephone Encounter (Addendum)
Spoke with patient regarding this information.  She states she cannot stop drinking for the allotted time frame and will require something different to treat her vaginosis.   She does understand that this is the best treatment to prevent ongoing issues, but states she will not stop drinking and requests something else.  She is very unhappy that we are still going back and forth on this and would like alternative tx request and ordered for tonight.  Allayne Gitelman, NP and D. Leone Payor, NP are both out of the office at the present.  Will forward and consult this pm with another provider to see what can be done.    Dr. Ermalene Searing consulted, see response.

## 2017-11-18 NOTE — Telephone Encounter (Signed)
Noted and agree. 

## 2017-11-18 NOTE — Telephone Encounter (Signed)
Copied from CRM [id number]. Topic: Quick Communication - See Telephone Encounter >> Nov 18, 2017 10:52 AM Floria Raveling A wrote: CRM for notification. See Telephone encounter for: 11/18/17. Pt called in and said that the pharmacist told her that if she use this med she would end up in the hosptial because she drinks.   metroNIDAZOLE (METROGEL) 0.75 % vaginal gel [[id number]]   Best number - [phone]

## 2017-12-02 ENCOUNTER — Telehealth: Payer: Self-pay | Admitting: Family Medicine

## 2017-12-02 NOTE — Telephone Encounter (Signed)
Please call patient and let her know that we need to check her urine. I don't see any recent urinary infections that are confirmed by culture and don't want to give her any unnecessary antibiotics.

## 2017-12-02 NOTE — Telephone Encounter (Signed)
Copied from CRM [id number]. Topic: Quick Communication - See Telephone Encounter >> Dec 02, 2017 11:34 AM Mare LoanBurton, Donna F wrote: Pt states that due to her history of UTI's she believes that she is experiencing symptoms and would like antibiotics called in [facility] [address]  Best number for pt is [phone]

## 2017-12-02 NOTE — Telephone Encounter (Signed)
Pt. called back.  Reported she completed the antibiotic cream x 5 days, for the vaginal infection. Stated about 1.5 days after intercourse she started having "tingling" discomfort at end of each voiding.  Stated she also has urinary frequency; will have urge to urinate about 10 minutes after voiding, and then only able to go a few drops, at that point.  Denied fever or chills.  Stated that she discussed frequent UTI's, post intercourse, with Olean Reeeborah Gessner.  Was advised to call and report symptoms, and something may be called in for her.   Will send note to Olean Reeeborah Gessner, for further recommendation.  Pt. Agreed with plan.

## 2017-12-02 NOTE — Telephone Encounter (Signed)
Left message for pt. To call back and discuss symptoms. She will need an office visit to be evaluated for UTI.

## 2017-12-03 NOTE — Telephone Encounter (Signed)
Called and spoke with patient. She states she doesn't know when she will be able to come in she has to work all week. Informed her of recommendations. Understanding verbalized nothing further needed at this time.

## 2017-12-04 ENCOUNTER — Ambulatory Visit: Payer: BLUE CROSS/BLUE SHIELD | Admitting: Internal Medicine

## 2017-12-04 ENCOUNTER — Encounter: Payer: Self-pay | Admitting: Internal Medicine

## 2017-12-04 VITALS — BP 104/68 | HR 76 | Temp 97.9°F | Wt 134.0 lb

## 2017-12-04 DIAGNOSIS — N3000 Acute cystitis without hematuria: Secondary | ICD-10-CM | POA: Diagnosis not present

## 2017-12-04 DIAGNOSIS — R3915 Urgency of urination: Secondary | ICD-10-CM | POA: Diagnosis not present

## 2017-12-04 DIAGNOSIS — R350 Frequency of micturition: Secondary | ICD-10-CM | POA: Diagnosis not present

## 2017-12-04 LAB — POC URINALSYSI DIPSTICK (AUTOMATED)
Bilirubin, UA: NEGATIVE
GLUCOSE UA: NEGATIVE
Ketones, UA: NEGATIVE
Nitrite, UA: NEGATIVE
Protein, UA: NEGATIVE
UROBILINOGEN UA: 1 U/dL
pH, UA: 6 (ref 5.0–8.0)

## 2017-12-04 MED ORDER — NITROFURANTOIN MONOHYD MACRO 100 MG PO CAPS: 100.0000 mg | ORAL_CAPSULE | Freq: Two times a day (BID) | ORAL | 0 refills | Status: DC

## 2017-12-04 NOTE — Addendum Note (Signed)
Addended by: Roena MaladyEVONTENNO, Kyzen Horn Y on: 12/04/2017 03:39 PM   Modules accepted: Orders

## 2017-12-04 NOTE — Patient Instructions (Signed)

## 2017-12-04 NOTE — Progress Notes (Signed)
HPI  Pt presents to the clinic today with c/o urinary urgency and frequency. She reports this started 1 week ago. She denies dysuria, blood in urine, vaginal discharge or odor. She denies fever, chills, nausea or low back pain. She has tried AZO with some relief.   Review of Systems  Past Medical History:  Diagnosis Date  . Anemia   . Dysrhythmia    Tacchy in HS.  Irregular with anemia.  Better now  . Hypothyroidism   . Kidney stones   . Migraines   . UTI (urinary tract infection)     Family History  Problem Relation Age of Onset  . Hypertension Mother   . Bladder Cancer Maternal Grandmother     Social History   Socioeconomic History  . Marital status: Married    Spouse name: Not on file  . Number of children: Not on file  . Years of education: Not on file  . Highest education level: Not on file  Occupational History  . Not on file  Social Needs  . Financial resource strain: Not on file  . Food insecurity:    Worry: Not on file    Inability: Not on file  . Transportation needs:    Medical: Not on file    Non-medical: Not on file  Tobacco Use  . Smoking status: Current Every Day Smoker    Packs/day: 1.00    Years: 20.00    Pack years: 20.00    Types: Cigarettes  . Smokeless tobacco: Never Used  Substance and Sexual Activity  . Alcohol use: Yes    Alcohol/week: 12.6 oz    Types: 7 Standard drinks or equivalent, 7 Glasses of wine, 7 Shots of liquor per week    Comment: daily liquor at night.  . Drug use: No  . Sexual activity: Yes  Lifestyle  . Physical activity:    Days per week: Not on file    Minutes per session: Not on file  . Stress: Not on file  Relationships  . Social connections:    Talks on phone: Not on file    Gets together: Not on file    Attends religious service: Not on file    Active member of club or organization: Not on file    Attends meetings of clubs or organizations: Not on file    Relationship status: Not on file  . Intimate  partner violence:    Fear of current or ex partner: Not on file    Emotionally abused: Not on file    Physically abused: Not on file    Forced sexual activity: Not on file  Other Topics Concern  . Not on file  Social History Narrative  . Not on file    No Known Allergies   Constitutional: Denies fever, malaise, fatigue, headache or abrupt weight changes.   GU: Pt reports urgency, frequency. Denies dysuria, burning sensation, blood in urine, odor or discharge. Skin: Denies redness, rashes, lesions or ulcercations.   No other specific complaints in a complete review of systems (except as listed in HPI above).    Objective:   Physical Exam  BP 104/68   Pulse 76   Temp 97.9 F (36.6 C) (Oral)   Wt 134 lb (60.8 kg)   LMP 10/24/2017 Comment: irregular  SpO2 98%   BMI 19.37 kg/m  Wt Readings from Last 3 Encounters:  12/04/17 134 lb (60.8 kg)  11/07/17 129 lb (58.5 kg)  08/05/17 134 lb (60.8 kg)  General: Appears her stated age, well developed, well nourished in NAD. Abdomen: Soft. Normal bowel sounds. No distention or masses noted.  Tender to palpation over the bladder area. No CVA tenderness.        Assessment & Plan:   Urgency, Frequency, secondary to Possible UTI:  Urinalysis: trace leuks, trace blood Will send urine culture eRx sent if for Macrobid 100 mg BID x 5 days OK to take AZO OTC Drink plenty of fluids  RTC as needed or if symptoms persist. Nicki Reaper, NP

## 2017-12-05 LAB — URINE CULTURE
MICRO NUMBER: 90710739
SPECIMEN QUALITY: ADEQUATE

## 2018-03-13 ENCOUNTER — Ambulatory Visit: Payer: BLUE CROSS/BLUE SHIELD | Admitting: Family Medicine

## 2018-03-13 ENCOUNTER — Encounter: Payer: Self-pay | Admitting: Family Medicine

## 2018-03-13 VITALS — BP 108/62 | HR 72 | Resp 16 | Ht 69.75 in | Wt 133.0 lb

## 2018-03-13 DIAGNOSIS — L309 Dermatitis, unspecified: Secondary | ICD-10-CM | POA: Diagnosis not present

## 2018-03-13 DIAGNOSIS — F1010 Alcohol abuse, uncomplicated: Secondary | ICD-10-CM

## 2018-03-13 MED ORDER — KETOCONAZOLE 2 % EX SHAM: 1.0000 "application " | MEDICATED_SHAMPOO | CUTANEOUS | 1 refills | Status: DC

## 2018-03-13 MED ORDER — CLOBETASOL PROPIONATE 0.05 % EX FOAM: Freq: Two times a day (BID) | CUTANEOUS | 1 refills | Status: DC

## 2018-03-13 NOTE — Progress Notes (Signed)
   Subjective:    Patient ID: Sarajane MarekAmanda G Arth, female    DOB: 04-08-1981, [age] y.o.   MRN: [id number]  HPI This is a 37 yo female who presents today with continued dandruff symptoms. Has used Ketoconazole shampoo with some improvement of itching, but continues to notice scaling and flaking. A little itchy behind ears. No rash or lesions anywhere else on body. Legs and axilla have been dry with shaving.   Social history- smokes 1 ppd, has stopped drinking. No etoh in last week.   Past Medical History:  Diagnosis Date  . Anemia   . Dysrhythmia    Tacchy in HS.  Irregular with anemia.  Better now  . Hypothyroidism   . Kidney stones   . Migraines   . UTI (urinary tract infection)    Past Surgical History:  Procedure Laterality Date  . COLONOSCOPY WITH PROPOFOL N/A 11/17/2015   Procedure: COLONOSCOPY WITH PROPOFOL;  Surgeon: Midge Miniumarren Wohl, MD;  [facility];  Service: Endoscopy;  Laterality: N/A;  . WISDOM TOOTH EXTRACTION     Family History  Problem Relation Age of Onset  . Hypertension Mother   . Bladder Cancer Maternal Grandmother    Social History   Tobacco Use  . Smoking status: Current Every Day Smoker    Packs/day: 1.00    Years: 20.00    Pack years: 20.00    Types: Cigarettes  . Smokeless tobacco: Never Used  Substance Use Topics  . Alcohol use: Yes    Alcohol/week: 21.0 standard drinks    Types: 7 Standard drinks or equivalent, 7 Glasses of wine, 7 Shots of liquor per week    Comment: daily liquor at night.  . Drug use: No      Review of Systems Per HPI    Objective:   Physical Exam  Constitutional: She appears well-developed and well-nourished. No distress.  Eyes: Conjunctivae are normal.  Cardiovascular: Normal rate.  Pulmonary/Chest: Effort normal.  Skin: Skin is warm and dry. She is not diaphoretic.  Front of scalp with small amount thickening and flaking. Few spots at occipital area.   Psychiatric: She has a normal mood and affect. Her  behavior is normal. Judgment and thought content normal.  Vitals reviewed.     BP 108/62 (BP Location: Left Arm, Patient Position: Sitting, Cuff Size: Normal)   Pulse 72   Resp 16   Ht 5' 9.75" (1.772 m)   Wt 133 lb (60.3 kg)   SpO2 99%   BMI 19.22 kg/m  Wt Readings from Last 3 Encounters:  03/13/18 133 lb (60.3 kg)  12/04/17 134 lb (60.8 kg)  11/07/17 129 lb (58.5 kg)       Assessment & Plan:  1. Eczema of scalp - Provided written and verbal information regarding diagnosis and treatment. - clobetasol (OLUX) 0.05 % topical foam; Apply topically 2 (two) times daily. For two to four weeks.  Dispense: 100 g; Refill: 1 - ketoconazole (NIZORAL) 2 % shampoo; Apply 1 application topically 2 (two) times a week.  Dispense: 120 mL; Refill: 1 - Ambulatory referral to Dermatology  2. Alcohol abuse -Encouraged patient's efforts for sobriety   Olean Reeeborah Gessner, FNP-BC  [facility], [facility]  03/13/2018 3:00 PM

## 2018-03-13 NOTE — Patient Instructions (Signed)
Good to see you today, I have sent in a refill of your shampoo as well as a steroid foam to use on the affected areas twice a day for 2 to 4 weeks  I have put in a dermatology referral, please stop at the desk to schedule

## 2018-03-17 ENCOUNTER — Other Ambulatory Visit: Payer: Self-pay | Admitting: Family Medicine

## 2018-03-17 NOTE — Telephone Encounter (Signed)
Please see rx refill request. Thanks-TLG 

## 2018-03-18 NOTE — Telephone Encounter (Signed)
Patient called in the wrong prescription. She called in the 25 mcg instead of the that had a refill.

## 2018-03-18 NOTE — Telephone Encounter (Signed)
Please call pharmacy and see when last filled. If she has not been taking, will need to have TSH before restarting. There is already an order, will just need a lab only visit.

## 2018-03-18 NOTE — Telephone Encounter (Signed)
Called and spoke to patient also informing her to schedule a lab only visit to recheck TSH. Patient states she will call back and schedule. Nothing further needed at this time.

## 2018-03-23 ENCOUNTER — Telehealth: Payer: Self-pay | Admitting: Family Medicine

## 2018-03-23 NOTE — Telephone Encounter (Signed)
Copied from CRM [id number]. Topic: Quick Communication - See Telephone Encounter >> Mar 23, 2018  1:58 PM Jens Som A wrote: CRM for notification. See Telephone encounter for: 03/23/18. Patient called she is not able to keep the April 03, 2018 appointment with Nita Sells  She is requesting Anatasia to cancel the appointment for her.  PEC advised patient of the telephone number she denied the telephone number and requesting office to cancel the appt.

## 2018-03-23 NOTE — Telephone Encounter (Signed)
Called patient back but had to leave a message with details. Cancelled dermatologist appointment and advised patient if still need to have this scheduled to let me know. Thank Neta Mends, RMA

## 2018-03-23 NOTE — Telephone Encounter (Signed)
Noted  

## 2018-06-22 ENCOUNTER — Telehealth: Payer: Self-pay | Admitting: Family Medicine

## 2018-06-22 ENCOUNTER — Other Ambulatory Visit: Payer: Self-pay | Admitting: *Deleted

## 2018-06-22 DIAGNOSIS — E039 Hypothyroidism, unspecified: Secondary | ICD-10-CM

## 2018-06-22 MED ORDER — LEVOTHYROXINE SODIUM 50 MCG PO TABS: ORAL_TABLET | ORAL | 0 refills | Status: DC

## 2018-06-22 NOTE — Telephone Encounter (Signed)
Pt calling and stated she was told by Eunice Blaseebbie after she take her last pill to call office and get a lab done to see what her levels are. Pt didn't if she need to sched an appt with Debbie or just labs. Pt don't have anymore pills left and want to know if she can't get some called in to last her until she can get her labs done or see Debbie to determine if see need to continue. Please advise. Didn't see any orders in the system for labs   Levothyroxine 50 mcg Sent to [facility] [address]

## 2018-06-22 NOTE — Telephone Encounter (Signed)
Spoke with patient who states she can come for labs on Friday.  Lab appt scheduled for Friday, June 26, 2018.  Patient asks if 15 tablets could be sent to her pharmacy to get her through until Eunice BlaseDebbie gets the lab results.  Rx sent.

## 2018-06-26 ENCOUNTER — Other Ambulatory Visit (INDEPENDENT_AMBULATORY_CARE_PROVIDER_SITE_OTHER): Payer: BLUE CROSS/BLUE SHIELD

## 2018-06-26 DIAGNOSIS — E038 Other specified hypothyroidism: Secondary | ICD-10-CM

## 2018-06-26 DIAGNOSIS — E039 Hypothyroidism, unspecified: Secondary | ICD-10-CM

## 2018-06-26 LAB — TSH: TSH: 2.82 u[IU]/mL (ref 0.35–4.50)

## 2018-06-27 MED ORDER — LEVOTHYROXINE SODIUM 50 MCG PO TABS: ORAL_TABLET | ORAL | 3 refills | Status: DC

## 2018-06-27 NOTE — Addendum Note (Signed)
Addended by: Olean Ree B on: 06/27/2018 08:34 AM   Modules accepted: Orders

## 2018-06-29 ENCOUNTER — Telehealth: Payer: Self-pay | Admitting: *Deleted

## 2018-06-29 NOTE — Telephone Encounter (Signed)
Spoke with Marchelle Folks who states the prescription prescribed by [facility] for clobetasol foam needs a PA or will cost her $500.  PA completed on CoverMyMeds.  Awaiting decision.

## 2018-08-24 ENCOUNTER — Emergency Department
Admission: EM | Admit: 2018-08-24 | Discharge: 2018-08-24 | Disposition: A | Discharge status: Home / Self Care | Payer: BLUE CROSS/BLUE SHIELD | Source: Home / Self Care | Attending: Emergency Medicine | Admitting: Emergency Medicine

## 2018-08-24 ENCOUNTER — Telehealth: Payer: Self-pay

## 2018-08-24 ENCOUNTER — Emergency Department
Admission: EM | Admit: 2018-08-24 | Discharge: 2018-08-24 | Disposition: A | Discharge status: Home / Self Care | Payer: BLUE CROSS/BLUE SHIELD | Attending: Emergency Medicine | Admitting: Emergency Medicine

## 2018-08-24 ENCOUNTER — Emergency Department: Payer: BLUE CROSS/BLUE SHIELD

## 2018-08-24 ENCOUNTER — Encounter: Payer: Self-pay | Admitting: Emergency Medicine

## 2018-08-24 ENCOUNTER — Other Ambulatory Visit: Payer: Self-pay

## 2018-08-24 DIAGNOSIS — F17210 Nicotine dependence, cigarettes, uncomplicated: Secondary | ICD-10-CM

## 2018-08-24 DIAGNOSIS — E039 Hypothyroidism, unspecified: Secondary | ICD-10-CM | POA: Insufficient documentation

## 2018-08-24 DIAGNOSIS — Z79899 Other long term (current) drug therapy: Secondary | ICD-10-CM

## 2018-08-24 DIAGNOSIS — R29898 Other symptoms and signs involving the musculoskeletal system: Secondary | ICD-10-CM | POA: Insufficient documentation

## 2018-08-24 DIAGNOSIS — Z5321 Procedure and treatment not carried out due to patient leaving prior to being seen by health care provider: Secondary | ICD-10-CM | POA: Diagnosis not present

## 2018-08-24 DIAGNOSIS — R531 Weakness: Secondary | ICD-10-CM | POA: Insufficient documentation

## 2018-08-24 DIAGNOSIS — M6281 Muscle weakness (generalized): Secondary | ICD-10-CM | POA: Diagnosis not present

## 2018-08-24 DIAGNOSIS — M25531 Pain in right wrist: Secondary | ICD-10-CM | POA: Diagnosis not present

## 2018-08-24 DIAGNOSIS — M79641 Pain in right hand: Secondary | ICD-10-CM | POA: Diagnosis not present

## 2018-08-24 MED ORDER — PREDNISONE 10 MG PO TABS: ORAL_TABLET | ORAL | 0 refills | Status: DC

## 2018-08-24 NOTE — ED Triage Notes (Signed)
Pt states that she went to bed Friday night and was fine and states that she woke up Saturday morning with weakness in the right hand, pt reports that there has been a lot of stress in the family but states that she grew concerned because if feels like it is spreading up her right arm. Pt denies any other symptoms. Pt has a steady gait to and from the registration desk, pt has equal facial symetry

## 2018-08-24 NOTE — ED Provider Notes (Signed)
[facility] Emergency Department Provider Note  ____________________________________________   First MD Initiated Contact with Patient 08/24/18 1608     (approximate)  I have reviewed the triage vital signs and the nursing notes.   HISTORY  Chief Complaint Hand Injury   HPI Nicole [name] is a 38 y.o. female Modena Jansky to the ED with complaint of numbness and tingling in her right hand and wrist.  Patient states that on Friday night she went to bed lying on her her right side with her arm extended above her head.  She believes that her head was lying on her right upper arm the entire night.  She denies any known injury to her arm.  She states that when she woke up Saturday that she noticed some weakness to her right hand and wrist.  She states that this is continued and today she noticed that she had difficulty typing on the computer keyboard.  She denies any previous injury to her right hand.  She has not taken any over-the-counter medication.     Past Medical History:  Diagnosis Date  . Anemia   . Dysrhythmia    Tacchy in HS.  Irregular with anemia.  Better now  . Hypothyroidism   . Kidney stones   . Migraines   . UTI (urinary tract infection)     Patient Active Problem List   Diagnosis Date Noted  . Acute cystitis with hematuria 10/03/2016  . B12 deficiency anemia 12/04/2015  . Noninfectious diarrhea   . Vitamin B12 deficiency 10/25/2015  . Hypothyroidism 10/25/2015  . Alcohol abuse 10/25/2015  . Nonspecific abnormal electrocardiogram (ECG) (EKG) 10/12/2015  . Symptomatic anemia 10/12/2015  . Tobacco abuse 06/01/2014  . History of nephrolithiasis 06/01/2014  . Low serum vitamin D 06/01/2014    Past Surgical History:  Procedure Laterality Date  . COLONOSCOPY WITH PROPOFOL N/A 11/17/2015   Procedure: COLONOSCOPY WITH PROPOFOL;  Surgeon: Midge Minium, MD;  [facility];  Service: Endoscopy;  Laterality: N/A;  . WISDOM TOOTH  EXTRACTION      Prior to Admission medications   Medication Sig Start Date End Date Taking? Authorizing Provider  clobetasol (OLUX) 0.05 % topical foam Apply topically 2 (two) times daily. For two to four weeks. 03/13/18   Emi Belfast, FNP  Cyanocobalamin (VITAMIN B-12 PO) Take by mouth daily.    [provider]  FOLIC ACID PO Take by mouth daily.    [provider]  ketoconazole (NIZORAL) 2 % shampoo Apply 1 application topically 2 (two) times a week. 03/16/18   Emi Belfast, FNP  levothyroxine (SYNTHROID, LEVOTHROID) 50 MCG tablet TAKE 1 TABLET BY MOUTH ONCE DAILY BEFORE BREAKFAST 06/27/18   Emi Belfast, FNP  Multiple Vitamin (MULTIVITAMIN WITH MINERALS) TABS tablet Take 1 tablet by mouth daily. 10/13/15   Enedina Finner, MD  predniSONE (DELTASONE) 10 MG tablet Take 6 tablets  today, on day 2 take 5 tablets, day 3 take 4 tablets, day 4 take 3 tablets, day 5 take  2 tablets and 1 tablet the last day 08/24/18   Tommi Rumps, PA-C    Allergies Patient has no known allergies.  Family History  Problem Relation Age of Onset  . Hypertension Mother   . Bladder Cancer Maternal Grandmother     Social History Social History   Tobacco Use  . Smoking status: Current Every Day Smoker    Packs/day: 1.00    Years: 20.00    Pack years:  20.00    Types: Cigarettes  . Smokeless tobacco: Never Used  Substance Use Topics  . Alcohol use: Not Currently    Comment: HIstory 3 drinks per day, stopped 9/19  . Drug use: No    Review of Systems Constitutional: No fever/chills Cardiovascular: Denies chest pain. Respiratory: Denies shortness of breath. Musculoskeletal: Positive right hand and wrist pain. Skin: Negative for rash. Neurological: Positive for focal weakness or numbness to right hand and wrist. ____________________________________________   PHYSICAL EXAM:  VITAL SIGNS: ED Triage Vitals  Enc Vitals Group     BP 08/24/18 1547 114/77     Pulse Rate  08/24/18 1547 91     Resp --      Temp 08/24/18 1547 98.2 F (36.8 C)     Temp Source 08/24/18 1547 Oral     SpO2 08/24/18 1547 99 %     Weight 08/24/18 1547 140 lb (63.5 kg)     Height 08/24/18 1547 5\' 11"  (1.803 m)     Head Circumference --      Peak Flow --      Pain Score 08/24/18 1550 0     Pain Loc --      Pain Edu? --      Excl. in GC? --    Constitutional: Alert and oriented. Well appearing and in no acute distress. Eyes: Conjunctivae are normal.  Head: Atraumatic. Neck: No stridor.  Cardiovascular: Normal rate, regular rhythm. Grossly normal heart sounds.  Good peripheral circulation. Respiratory: Normal respiratory effort.  No retractions. Lungs CTAB. Musculoskeletal: On examination of the right hand there is no gross deformity, edema or discoloration.  Patient is able to grip but is slightly weaker in comparison to the left.  Skin is intact.  Capillary refill is less than 3 seconds.  No edema or injury to the skin is noted.  Patient is able to flex digits without any difficulty and also against resistance.  Patient has slight discomfort and difficulty with full extension.  Motor or sensory function intact.  Pulses present.  Nontender to palpation.  No point tenderness is noted to palpation of both the forearm and upper extremity.  No evidence of injury. Neurologic:  Normal speech and language. No gross focal neurologic deficits are appreciated.  Skin:  Skin is warm, dry and intact.  No ecchymosis, abrasions or erythema is noted. Psychiatric: Mood and affect are normal. Speech and behavior are normal.  ____________________________________________   LABS (all labs ordered are listed, but only abnormal results are displayed)  Labs Reviewed - No data to display  RADIOLOGY  Official radiology report(s): Dg Hand Complete Right  Result Date: 08/24/2018 CLINICAL DATA:  Right hand pain and numbness. EXAM: RIGHT HAND - COMPLETE 3+ VIEW COMPARISON:  None. FINDINGS: The joint  spaces are maintained. No acute bony findings, degenerative or erosive changes. No abnormal soft tissue calcifications. IMPRESSION: Normal right hand radiographs. Electronically Signed   By: Rudie Meyer M.D.   On: 08/24/2018 16:44    ____________________________________________   PROCEDURES  Procedure(s) performed (including Critical Care):  Procedures  Patient was placed in a cock-up wrist splint prefabricated by ED tech. ____________________________________________   INITIAL IMPRESSION / ASSESSMENT AND PLAN / ED COURSE  As part of my medical decision making, I reviewed the following data within the electronic MEDICAL RECORD NUMBER Notes from prior ED visits and [place] Controlled Substance Database  Patient presents to the ED with complaint of numbness and tingling to her right hand and wrist after sleeping  on her arm over the weekend.  There is been no other injury.  On exam there is no gross deformity and x-ray shows no bony injury.  There is some weakness with muscle strength of the right hand in relationship to the left.  Patient is able to grip loosely and has no difficulty with flexion of the digits.  Physical exam is otherwise unremarkable.  Patient was made aware that most likely she would need to have a nerve conduction study for a possible thoracic outlet syndrome secondary to her sleeping on her arm.  Patient was placed in a cock-up wrist splint for support and protection.  She was also placed on a steroid taper.  She is to call and make an appointment with the [facility] orthopedic department tomorrow for evaluation.  ____________________________________________   FINAL CLINICAL IMPRESSION(S) / ED DIAGNOSES  Final diagnoses:  Right hand weakness     ED Discharge Orders         Ordered    predniSONE (DELTASONE) 10 MG tablet     08/24/18 1705           Note:  This document was prepared using Dragon voice recognition software and may include unintentional dictation  errors.    Makaelynn, Hersman, PA-C 08/24/18 Ara Kussmaul, MD 08/24/18 2010

## 2018-08-24 NOTE — Telephone Encounter (Signed)
Noted  

## 2018-08-24 NOTE — ED Notes (Signed)
Pt states numbness and tingling to right hand and wrist. 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 Pt states she can move her fingers and hand but not backwards

## 2018-08-24 NOTE — ED Triage Notes (Signed)
PT c/o RT hand pain and unable to grasp as well xfew days. Pt unaware of acute injury. NAD noted

## 2018-08-24 NOTE — Telephone Encounter (Signed)
Per chart review tab pt is at [facility] ED now. FYI to Harlin Heys FNP.

## 2018-08-24 NOTE — Telephone Encounter (Signed)
[facility] Night - Client TELEPHONE ADVICE RECORD [facility] Patient Name: Nicole [name] Gender: Female DOB: [birth year] Age: 38 Y 1 M 3 D Return Phone Number: [phone] (Primary) Address[place] [address] Client [facility] Night - Client Client Site [facility] [place] - Night Physician Deboraha Sprang- NP Contact Type Call Who Is Calling Patient / Member / Family / Caregiver Call Type Triage / Clinical Relationship To Patient Self Return Phone Number [phone] (Primary) Chief Complaint NUMBNESS/TINGLING- sudden on one side of the body or face Reason for Call Request to Schedule Office Appointment Initial Comment Caller states she wants an appt. She has no use of her right hand, she can't move her right, fingers are numb, tingling, can't squeeze or hold anything. Translation No Nurse Assessment Nurse: Violeta Gelinas, RN, Regulatory affairs officer (Eastern Time): 08/23/2018 7:14:47 PM Confirm and document reason for call. If symptomatic, describe symptoms. ---Caller states she wants an appt. She has no use of her right hand, she can't move her right, fingers are numb, tingling, can't squeeze or hold anything, had started suddenly on saturday. is a smoker. Has the patient traveled to [place] OR had close contact with a person known to have the novel coronavirus illness in the last 14 days? ---Not Applicable Does the patient have any new or worsening symptoms? ---Yes Will a triage be completed? ---Yes Related visit to physician within the last 2 weeks? ---No Does the PT have any chronic conditions? (i.e. diabetes, asthma, this includes High risk factors for pregnancy, etc.) ---No Is the patient pregnant or possibly pregnant? (Ask all females between the ages of 33-55) ---No Is this a behavioral health or substance abuse call? ---No Guidelines Guideline Title Affirmed Question Affirmed  Notes Nurse Date/Time (Eastern Time) Neurologic Deficit [1] Weakness (i.e., paralysis, loss of muscle strength) of the face, arm / hand, or leg / foot on one side of the body AND [2] Violeta Gelinas, RN, [facility] 08/23/2018 7:16:38 PM PLEASE NOTE: All timestamps contained within this report are represented as [place] Standard Time. CONFIDENTIALTY NOTICE: This fax transmission is intended only for the addressee. It contains information that is legally privileged, confidential or otherwise protected from use or disclosure. If you are not the intended recipient, you are strictly prohibited from reviewing, disclosing, copying using or disseminating any of this information or taking any action in reliance on or regarding this information. If you have received this fax in error, please notify us immediately by telephone so that we can arrange for its return to [place]. Phone: [phone], Toll-Free: [phone], Fax: [phone] Page: 2 of 2 Call Id: [id number] Guidelines Guideline Title Affirmed Question Affirmed Notes Nurse Date/Time Lamount Cohen Time) sudden onset AND [3] present now Disp. Time Lamount Cohen Time) Disposition Final User 08/23/2018 7:11:15 PM Send to Urgent Glade Stanford 08/23/2018 7:26:03 PM 911 Outcome Documentation Whiteley, RN, Hospital doctor Reason: refused 911, states will probably go to ER tomorrow, states that had a funeral today and won't be able to follow recommendation 08/23/2018 7:22:07 PM Call EMS 911 Now Yes Violeta Gelinas, RN, Museum/gallery exhibitions officer Understands Yes PreDisposition Did not know what to do Care Advice Given Per Guideline CALL EMS 911 NOW: * Immediate medical attention is needed. You need to hang up and call 911 (or an ambulance). * Triager Discretion: I'll call you back in a few minutes to be sure you were able to reach them. CARE ADVICE given per Neurologic Deficit (Adult) guideline. Comments User: Joice Lofts,  Violeta Gelinas, RN Date/Time Lamount Cohen Time):  08/23/2018 7:16:23 PM types every day for work.

## 2018-08-24 NOTE — Discharge Instructions (Signed)
Call make an appointment with Dr. Allena Katz at the orthopedic department of [facility].  Most likely you will need further evaluation and testing. Wear cock-up wrist splint for support and protection.  Begin taking prednisone for the next 6 days tapering down by 1 tablet each day.

## 2018-08-24 NOTE — ED Triage Notes (Signed)
Patient to ED with pain in the right hand and wrist that extends up into her forearm. 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 Symptoms started on Saturday and has progressively worse.

## 2018-08-25 DIAGNOSIS — M9901 Segmental and somatic dysfunction of cervical region: Secondary | ICD-10-CM | POA: Diagnosis not present

## 2018-08-25 DIAGNOSIS — M9907 Segmental and somatic dysfunction of upper extremity: Secondary | ICD-10-CM | POA: Diagnosis not present

## 2018-08-25 DIAGNOSIS — M531 Cervicobrachial syndrome: Secondary | ICD-10-CM | POA: Diagnosis not present

## 2018-08-27 DIAGNOSIS — M9901 Segmental and somatic dysfunction of cervical region: Secondary | ICD-10-CM | POA: Diagnosis not present

## 2018-08-27 DIAGNOSIS — M9907 Segmental and somatic dysfunction of upper extremity: Secondary | ICD-10-CM | POA: Diagnosis not present

## 2018-08-27 DIAGNOSIS — M531 Cervicobrachial syndrome: Secondary | ICD-10-CM | POA: Diagnosis not present

## 2018-08-27 DIAGNOSIS — M7918 Myalgia, other site: Secondary | ICD-10-CM | POA: Diagnosis not present

## 2018-09-01 DIAGNOSIS — M9907 Segmental and somatic dysfunction of upper extremity: Secondary | ICD-10-CM | POA: Diagnosis not present

## 2018-09-01 DIAGNOSIS — M531 Cervicobrachial syndrome: Secondary | ICD-10-CM | POA: Diagnosis not present

## 2018-09-01 DIAGNOSIS — M9901 Segmental and somatic dysfunction of cervical region: Secondary | ICD-10-CM | POA: Diagnosis not present

## 2018-09-01 DIAGNOSIS — M7918 Myalgia, other site: Secondary | ICD-10-CM | POA: Diagnosis not present

## 2018-09-07 DIAGNOSIS — M531 Cervicobrachial syndrome: Secondary | ICD-10-CM | POA: Diagnosis not present

## 2018-09-07 DIAGNOSIS — M9901 Segmental and somatic dysfunction of cervical region: Secondary | ICD-10-CM | POA: Diagnosis not present

## 2018-09-07 DIAGNOSIS — M9907 Segmental and somatic dysfunction of upper extremity: Secondary | ICD-10-CM | POA: Diagnosis not present

## 2018-09-07 DIAGNOSIS — M7918 Myalgia, other site: Secondary | ICD-10-CM | POA: Diagnosis not present

## 2019-03-08 ENCOUNTER — Other Ambulatory Visit: Payer: Self-pay

## 2019-03-08 ENCOUNTER — Encounter: Payer: Self-pay | Admitting: Family Medicine

## 2019-03-08 ENCOUNTER — Ambulatory Visit (INDEPENDENT_AMBULATORY_CARE_PROVIDER_SITE_OTHER): Payer: BC Managed Care – PPO | Admitting: Family Medicine

## 2019-03-08 VITALS — BP 92/58 | HR 84 | Temp 98.0°F | Ht 71.0 in | Wt 136.8 lb

## 2019-03-08 DIAGNOSIS — G4700 Insomnia, unspecified: Secondary | ICD-10-CM

## 2019-03-08 DIAGNOSIS — K219 Gastro-esophageal reflux disease without esophagitis: Secondary | ICD-10-CM

## 2019-03-08 MED ORDER — TRAZODONE HCL 50 MG PO TABS: 25.0000 mg | ORAL_TABLET | Freq: Every evening | ORAL | 3 refills | Status: DC | PRN

## 2019-03-08 NOTE — Progress Notes (Signed)
Subjective:    Patient ID: Nicole [name], female    DOB: 04-29-1981, 38 y.o.   MRN: [id number]  HPI This is a 38 yo female who presents today with complaint of severe heartburn/acid indigestion and insomnia. She has been doing ok with pandemic. Got laid off. 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   Insomnia- Stopped drinking earlier this year (March), had been an every day drinker. Has been having difficulty sleeping. Frequently awakens and can't go back to sleep. Taking Unisom 2x/ dose every night. Stress level has been ok. Good family support. Previously on alprazolam but didn't like the way it made her feel.   Acid indigestion started around same time. Feels like burning in chest. Intermittent at first, took OTC (?) with relief. Was 1x/ week, now 3/x week. No caffeine. Smokes. Can't identify triggers. No relief with Tums. Some relief with drinking milk. No changes in BM. No symptoms at night, just daytime.    Past Medical History:  Diagnosis Date  . Anemia   . Dysrhythmia    Tacchy in HS.  Irregular with anemia.  Better now  . Hypothyroidism   . Kidney stones   . Migraines   . UTI (urinary tract infection)    Past Surgical History:  Procedure Laterality Date  . COLONOSCOPY WITH PROPOFOL N/A 11/17/2015   Procedure: COLONOSCOPY WITH PROPOFOL;  Surgeon: Lucilla Lame, MD;  Location: [facility];  Service: Endoscopy;  Laterality: N/A;  . WISDOM TOOTH EXTRACTION     Family History  Problem Relation Age of Onset  . Hypertension Mother   . Bladder Cancer Maternal Grandmother    Social History   Tobacco Use  . Smoking status: Current Every Day Smoker    Packs/day: 1.00    Years: 20.00    Pack years: 20.00    Types: Cigarettes  . Smokeless tobacco: Never Used  Substance Use Topics  . Alcohol use: Not Currently    Comment: HIstory 3 drinks per day, stopped 9/19  . Drug use: No     Review of  Systems Per HPI    Objective:   Physical Exam Vitals signs reviewed.  Constitutional:      General: She is not in acute distress.    Appearance: Normal appearance. She is normal weight. She is not ill-appearing, toxic-appearing or diaphoretic.  HENT:     Head: Normocephalic and atraumatic.  Eyes:     Conjunctiva/sclera: Conjunctivae normal.  Cardiovascular:     Rate and Rhythm: Normal rate.  Pulmonary:     Effort: Pulmonary effort is normal.  Neurological:     Mental Status: She is alert and oriented to person, place, and time.  Psychiatric:        Mood and Affect: Mood normal.        Behavior: Behavior normal.        Thought Content: Thought content normal.        Judgment: Judgment normal.       BP (!) 92/58 (BP Location: Left Arm, Patient Position: Sitting, Cuff Size: Normal)   Pulse 84   Temp 98 F (36.7 C) (Temporal)   Ht 5\' 11"  (1.803 m)   Wt 136 lb 12.8 oz (62.1 kg)   SpO2 100%   BMI 19.08 kg/m  Wt Readings from Last 3 Encounters:  03/08/19 136 lb 12.8 oz (62.1 kg)  08/24/18 140 lb (63.5 kg)  03/13/18 133 lb (60.3 kg)  Assessment & Plan:  1. Insomnia, unspecified type - encouraged good sleep hygiene, provided information about relaxation/meditation apps - traZODone (DESYREL) 50 MG tablet; Take 0.5-1 tablets (25-50 mg total) by mouth at bedtime as needed for sleep.  Dispense: 30 tablet; Refill: 3  2. Gastroesophageal reflux disease, esophagitis presence not specified - Provided written and verbal information regarding diagnosis and treatment. - will start with otc famotidine for 4 weeks, then can wean. If needed, can go to PPI for 1-2 months - encouraged her to try to identify triggers and avoid    [name], FNP-BC  [facility]  03/08/2019 2:31 PM

## 2019-03-08 NOTE — Patient Instructions (Addendum)
Get over the counter famotadine (brand name is Pepcid) and take daily for 4 weeks, then can decrease if symptoms improved.   Begin a Mindfulness/Meditation practice -- this can take a little as 3 minutes and is helpful for all kinds of mood issues -- You can find resources in books -- Or you can download apps like  -- Headspace App  -- Calm  -- Insignt Timer -- Stop, Breathe & Think   # With each of these Apps - you should decline the "start free trial" offer and as you search through the App should be able to access some of their free content. You can also chose to pay for the content if you find one that works well for you.    # Many of them also offer sleep specific content which may help with insomnia    Gastroesophageal Reflux Disease, Adult Gastroesophageal reflux (GER) happens when acid from the stomach flows up into the tube that connects the mouth and the stomach (esophagus). Normally, food travels down the esophagus and stays in the stomach to be digested. However, when a person has GER, food and stomach acid sometimes move back up into the esophagus. If this becomes a more serious problem, the person may be diagnosed with a disease called gastroesophageal reflux disease (GERD). GERD occurs when the reflux:  Happens often.  Causes frequent or severe symptoms.  Causes problems such as damage to the esophagus. When stomach acid comes in contact with the esophagus, the acid may cause soreness (inflammation) in the esophagus. Over time, GERD may create small holes (ulcers) in the lining of the esophagus. What are the causes? This condition is caused by a problem with the muscle between the esophagus and the stomach (lower esophageal sphincter, or LES). Normally, the LES muscle closes after food passes through the esophagus to the stomach. When the LES is weakened or abnormal, it does not close properly, and that allows food and stomach acid to go back up into the esophagus. The LES  can be weakened by certain dietary substances, medicines, and medical conditions, including:  Tobacco use.  Pregnancy.  Having a hiatal hernia.  Alcohol use.  Certain foods and beverages, such as coffee, chocolate, onions, and peppermint. What increases the risk? You are more likely to develop this condition if you:  Have an increased body weight.  Have a connective tissue disorder.  Use NSAID medicines. What are the signs or symptoms? Symptoms of this condition include:  Heartburn.  Difficult or painful swallowing.  The feeling of having a lump in the throat.  Abitter taste in the mouth.  Bad breath.  Having a large amount of saliva.  Having an upset or bloated stomach.  Belching.  Chest pain. Different conditions can cause chest pain. Make sure you see your health care provider if you experience chest pain.  Shortness of breath or wheezing.  Ongoing (chronic) cough or a night-time cough.  Wearing away of tooth enamel.  Weight loss. How is this diagnosed? Your health care provider will take a medical history and perform a physical exam. To determine if you have mild or severe GERD, your health care provider may also monitor how you respond to treatment. You may also have tests, including:  A test to examine your stomach and esophagus with a small camera (endoscopy).  A test thatmeasures the acidity level in your esophagus.  A test thatmeasures how much pressure is on your esophagus.  A barium swallow or modified barium swallow  test to show the shape, size, and functioning of your esophagus. How is this treated? The goal of treatment is to help relieve your symptoms and to prevent complications. Treatment for this condition may vary depending on how severe your symptoms are. Your health care provider may recommend:  Changes to your diet.  Medicine.  Surgery. Follow these instructions at home: Eating and drinking   Follow a diet as recommended by  your health care provider. This may involve avoiding foods and drinks such as: ? Coffee and tea (with or without caffeine). ? Drinks that containalcohol. ? Energy drinks and sports drinks. ? Carbonated drinks or sodas. ? Chocolate and cocoa. ? Peppermint and mint flavorings. ? Garlic and onions. ? Horseradish. ? Spicy and acidic foods, including peppers, chili powder, curry powder, vinegar, hot sauces, and barbecue sauce. ? Citrus fruit juices and citrus fruits, such as oranges, lemons, and limes. ? Tomato-based foods, such as red sauce, chili, salsa, and pizza with red sauce. ? Fried and fatty foods, such as donuts, french fries, potato chips, and high-fat dressings. ? High-fat meats, such as hot dogs and fatty cuts of red and white meats, such as rib eye steak, sausage, ham, and bacon. ? High-fat dairy items, such as whole milk, butter, and cream cheese.  Eat small, frequent meals instead of large meals.  Avoid drinking large amounts of liquid with your meals.  Avoid eating meals during the 2-3 hours before bedtime.  Avoid lying down right after you eat.  Do not exercise right after you eat. Lifestyle   Do not use any products that contain nicotine or tobacco, such as cigarettes, e-cigarettes, and chewing tobacco. If you need help quitting, ask your health care provider.  Try to reduce your stress by using methods such as yoga or meditation. If you need help reducing stress, ask your health care provider.  If you are overweight, reduce your weight to an amount that is healthy for you. Ask your health care provider for guidance about a safe weight loss goal. General instructions  Pay attention to any changes in your symptoms.  Take over-the-counter and prescription medicines only as told by your health care provider. Do not take aspirin, ibuprofen, or other NSAIDs unless your health care provider told you to do so.  Wear loose-fitting clothing. Do not wear anything tight  around your waist that causes pressure on your abdomen.  Raise (elevate) the head of your bed about 6 inches (15 cm).  Avoid bending over if this makes your symptoms worse.  Keep all follow-up visits as told by your health care provider. This is important. Contact a health care provider if:  You have: ? New symptoms. ? Unexplained weight loss. ? Difficulty swallowing or it hurts to swallow. ? Wheezing or a persistent cough. ? A hoarse voice.  Your symptoms do not improve with treatment. Get help right away if you:  Have pain in your arms, neck, jaw, teeth, or back.  Feel sweaty, dizzy, or light-headed.  Have chest pain or shortness of breath.  Vomit and your vomit looks like blood or coffee grounds.  Faint.  Have stool that is bloody or black.  Cannot swallow, drink, or eat. Summary  Gastroesophageal reflux happens when acid from the stomach flows up into the esophagus. GERD is a disease in which the reflux happens often, causes frequent or severe symptoms, or causes problems such as damage to the esophagus.  Treatment for this condition may vary depending on how severe your  symptoms are. Your health care provider may recommend diet and lifestyle changes, medicine, or surgery.  Contact a health care provider if you have new or worsening symptoms.  Take over-the-counter and prescription medicines only as told by your health care provider. Do not take aspirin, ibuprofen, or other NSAIDs unless your health care provider told you to do so.  Keep all follow-up visits as told by your health care provider. This is important. This information is not intended to replace advice given to you by your health care provider. Make sure you discuss any questions you have with your health care provider. Document Released: 03/20/2005 Document Revised: 12/17/2017 Document Reviewed: 12/17/2017 Elsevier Patient Education  2020 [employer].

## 2019-06-16 ENCOUNTER — Encounter: Payer: Self-pay | Admitting: Family Medicine

## 2019-06-16 ENCOUNTER — Other Ambulatory Visit: Payer: Self-pay

## 2019-06-16 ENCOUNTER — Ambulatory Visit (INDEPENDENT_AMBULATORY_CARE_PROVIDER_SITE_OTHER): Payer: BC Managed Care – PPO | Admitting: Family Medicine

## 2019-06-16 VITALS — BP 90/64 | HR 85 | Temp 98.2°F | Ht 71.0 in | Wt 141.8 lb

## 2019-06-16 DIAGNOSIS — G4700 Insomnia, unspecified: Secondary | ICD-10-CM | POA: Diagnosis not present

## 2019-06-16 DIAGNOSIS — E039 Hypothyroidism, unspecified: Secondary | ICD-10-CM | POA: Diagnosis not present

## 2019-06-16 DIAGNOSIS — Z862 Personal history of diseases of the blood and blood-forming organs and certain disorders involving the immune mechanism: Secondary | ICD-10-CM

## 2019-06-16 DIAGNOSIS — N926 Irregular menstruation, unspecified: Secondary | ICD-10-CM

## 2019-06-16 DIAGNOSIS — R7989 Other specified abnormal findings of blood chemistry: Secondary | ICD-10-CM | POA: Diagnosis not present

## 2019-06-16 NOTE — Patient Instructions (Signed)
Good to see you today  Try to take your trazadone 1 tablet an hour before bedtime. Try for several nights, can increase to 1 1/2 tablets if no improvement up to 2 tablets.   I will notify you of your lab results

## 2019-06-16 NOTE — Progress Notes (Signed)
Subjective:    Patient ID: Sarajane Marek, female    DOB: 1980-10-14, 38 y.o.   MRN: [id number]  HPI Chief Complaint  Patient presents with  . Hypothyroidism    want thyroid level checked  . Insomnia    Trazodone not really working   This is a 38 yo female who presents today with above chief complaint She was laid off from [employer] 6/20. This has been very difficult for her. Has not been able to find a new job.   Has not been feeling "right." Periods have been irregular, skipped a month, hair thinner, breast tenderness, has never used birth control and has never gotten pregnant. More irritable. No hot flashes. No regular schedule since not working, no regular exercise. Can't think about stopping smoking. Remains abstinent from alcohol.   Hypothyroidism- has caused irregular periods in past  Insomnia- trazadone seemed to work at first. Over last month, has had more racing thoughts. Has tried to do breathing exercises. Will take a Unisom. Has to get up to urinate and can't go back to sleep.   Past Medical History:  Diagnosis Date  . Anemia   . Dysrhythmia    Tacchy in HS.  Irregular with anemia.  Better now  . Hypothyroidism   . Kidney stones   . Migraines   . UTI (urinary tract infection)    Past Surgical History:  Procedure Laterality Date  . COLONOSCOPY WITH PROPOFOL N/A 11/17/2015   Procedure: COLONOSCOPY WITH PROPOFOL;  Surgeon: Midge Minium, MD;  Location: [facility];  Service: Endoscopy;  Laterality: N/A;  . WISDOM TOOTH EXTRACTION     Family History  Problem Relation Age of Onset  . Hypertension Mother   . Bladder Cancer Maternal Grandmother    Social History   Tobacco Use  . Smoking status: Current Every Day Smoker    Packs/day: 1.00    Years: 20.00    Pack years: 20.00    Types: Cigarettes  . Smokeless tobacco: Never Used  Substance Use Topics  . Alcohol use: Not Currently    Comment: HIstory 3 drinks per day, stopped 9/19; recently quit drinking  as of 08/2018  . Drug use: No      Review of Systems Per HPI    Objective:   Physical Exam Vitals reviewed.  Constitutional:      General: She is not in acute distress.    Appearance: Normal appearance. She is normal weight. She is not ill-appearing, toxic-appearing or diaphoretic.  Eyes:     Conjunctiva/sclera: Conjunctivae normal.  Cardiovascular:     Rate and Rhythm: Normal rate.  Pulmonary:     Effort: Pulmonary effort is normal.  Neurological:     Mental Status: She is alert and oriented to person, place, and time.  Psychiatric:        Mood and Affect: Mood normal.        Behavior: Behavior normal.        Thought Content: Thought content normal.        Judgment: Judgment normal.       BP 90/64   Pulse 85   Temp 98.2 F (36.8 C) (Temporal)   Ht 5\' 11"  (1.803 m)   Wt 141 lb 12 oz (64.3 kg)   SpO2 99%   BMI 19.77 kg/m  Wt Readings from Last 3 Encounters:  06/16/19 141 lb 12 oz (64.3 kg)  03/08/19 136 lb 12.8 oz (62.1 kg)  08/24/18 140 lb (63.5 kg)   Depression  screen [facility] 2/9 06/16/2019 11/07/2017 11/02/2014  Decreased Interest 0 0 0  Down, Depressed, Hopeless 1 0 0  PHQ - 2 Score 1 0 0       Assessment & Plan:  1. Low serum vitamin D - Comprehensive metabolic panel - Vitamin D, 25-hydroxy  2. Acquired hypothyroidism - TSH - Comprehensive metabolic panel  3. Insomnia, unspecified type - encouraged regular bedtime routine, regular exercise, time out doors, can increase trazodone to 1/5 - 2 tablets 1 hour before bedtime - suspect increased difficulties related to job loss, increased stress - discussed non medication ways to cope and can discuss medication at follow up if no improvement in symptoms  4. History of anemia - Comprehensive metabolic panel - CBC with Differential  5. Irregular periods - possibly peri-menopausal, discussed importance of adequate sleep, exercise, healthy food choices - she asked about OCP for symptom treatment, not a  candidate due to smoking and age   Clarene Reamer, FNP-BC  [facility], [place]  06/17/2019 7:28 AM

## 2019-06-17 ENCOUNTER — Encounter: Payer: Self-pay | Admitting: Family Medicine

## 2019-06-17 LAB — VITAMIN D 25 HYDROXY (VIT D DEFICIENCY, FRACTURES): VITD: 53.62 ng/mL (ref 30.00–100.00)

## 2019-06-17 LAB — CBC WITH DIFFERENTIAL/PLATELET
Basophils Absolute: 0.1 10*3/uL (ref 0.0–0.1)
Basophils Relative: 0.8 % (ref 0.0–3.0)
Eosinophils Absolute: 0.3 10*3/uL (ref 0.0–0.7)
Eosinophils Relative: 3.5 % (ref 0.0–5.0)
HCT: 43.7 % (ref 36.0–46.0)
Hemoglobin: 14.1 g/dL (ref 12.0–15.0)
Lymphocytes Relative: 29.3 % (ref 12.0–46.0)
Lymphs Abs: 2.3 10*3/uL (ref 0.7–4.0)
MCHC: 32.4 g/dL (ref 30.0–36.0)
MCV: 93.2 fl (ref 78.0–100.0)
Monocytes Absolute: 0.6 10*3/uL (ref 0.1–1.0)
Monocytes Relative: 7.9 % (ref 3.0–12.0)
Neutro Abs: 4.7 10*3/uL (ref 1.4–7.7)
Neutrophils Relative %: 58.5 % (ref 43.0–77.0)
Platelets: 277 10*3/uL (ref 150.0–400.0)
RBC: 4.69 Mil/uL (ref 3.87–5.11)
RDW: 15.3 % (ref 11.5–15.5)
WBC: 8 10*3/uL (ref 4.0–10.5)

## 2019-06-17 LAB — COMPREHENSIVE METABOLIC PANEL
ALT: 23 U/L (ref 0–35)
AST: 21 U/L (ref 0–37)
Albumin: 4.1 g/dL (ref 3.5–5.2)
Alkaline Phosphatase: 54 U/L (ref 39–117)
BUN: 13 mg/dL (ref 6–23)
CO2: 26 mEq/L (ref 19–32)
Calcium: 9.4 mg/dL (ref 8.4–10.5)
Chloride: 105 mEq/L (ref 96–112)
Creatinine, Ser: 0.74 mg/dL (ref 0.40–1.20)
GFR: 86.53 mL/min (ref >60.00)
Glucose, Bld: 87 mg/dL (ref 70–99)
Potassium: 4.1 mEq/L (ref 3.5–5.1)
Sodium: 138 mEq/L (ref 135–145)
Total Bilirubin: 0.2 mg/dL (ref 0.2–1.2)
Total Protein: 6.7 g/dL (ref 6.0–8.3)

## 2019-06-17 LAB — TSH: TSH: 3.6 u[IU]/mL (ref 0.35–4.50)

## 2019-07-27 ENCOUNTER — Other Ambulatory Visit: Payer: Self-pay | Admitting: Family Medicine

## 2019-07-27 DIAGNOSIS — E039 Hypothyroidism, unspecified: Secondary | ICD-10-CM

## 2019-08-11 ENCOUNTER — Other Ambulatory Visit: Payer: Self-pay | Admitting: Family Medicine

## 2019-08-11 DIAGNOSIS — G4700 Insomnia, unspecified: Secondary | ICD-10-CM

## 2019-08-11 NOTE — Telephone Encounter (Signed)
Last OV 03/08/2019 Last refill 03/08/2019 #30 x 3 refills.   Please advise, thanks.

## 2019-11-03 ENCOUNTER — Other Ambulatory Visit: Payer: Self-pay | Admitting: Family Medicine

## 2019-11-03 DIAGNOSIS — E039 Hypothyroidism, unspecified: Secondary | ICD-10-CM

## 2019-11-03 NOTE — Telephone Encounter (Signed)
Patient called about refill request  Stated she ran out of medication today

## 2020-02-01 DIAGNOSIS — Z20822 Contact with and (suspected) exposure to covid-19: Secondary | ICD-10-CM | POA: Diagnosis not present

## 2020-02-21 ENCOUNTER — Telehealth: Payer: Self-pay | Admitting: *Deleted

## 2020-02-21 ENCOUNTER — Telehealth (INDEPENDENT_AMBULATORY_CARE_PROVIDER_SITE_OTHER): Payer: BC Managed Care – PPO | Admitting: Family Medicine

## 2020-02-21 ENCOUNTER — Encounter: Payer: Self-pay | Admitting: Family Medicine

## 2020-02-21 VITALS — Temp 97.8°F | Ht 71.0 in

## 2020-02-21 DIAGNOSIS — R05 Cough: Secondary | ICD-10-CM | POA: Diagnosis not present

## 2020-02-21 DIAGNOSIS — R059 Cough, unspecified: Secondary | ICD-10-CM

## 2020-02-21 DIAGNOSIS — U071 COVID-19: Secondary | ICD-10-CM

## 2020-02-21 MED ORDER — HYDROCODONE-HOMATROPINE 5-1.5 MG/5ML PO SYRP: 5.0000 mL | ORAL_SOLUTION | Freq: Three times a day (TID) | ORAL | 0 refills | Status: DC | PRN

## 2020-02-21 NOTE — Progress Notes (Signed)
Virtual Visit via Video Note  I connected with Nicole [name] on 02/21/20 at  4:15 PM EDT by a video enabled telemedicine application and verified that I am speaking with the correct person using two identifiers.  Location: Patient: In her home Provider: [facility] Persons participating in virtual visit: Patient, provider   I discussed the limitations of evaluation and management by telemedicine and the availability of in person appointments. The patient expressed understanding and agreed to proceed.  History of Present Illness: Chief Complaint  Patient presents with  . covid positive    cough, sneezing, chest congestion, can't smell or taste.   This is a 39 year old female who presents today for virtual visit for above chief complaint.  She began having symptoms on 02/12/2020, headache, fever, cough, body aches.  She was tested for COVID-19 96 and her test came back positive.  She reports that she feels better today.  She continues to have a slightly productive cough which is worse with deep breaths.  She feels slightly winded with talking.  No fever, headache.  Intermittent ear pressure, sore throat related to cough Taking DayQuil and NyQuil with little relief.  She has not been vaccinated against COVID-19.   Observations/Objective: Patient is alert and answers questions appropriately.  Visible skin is unremarkable.  Respirations are even and unlabored there is no increased work of breathing with conversation.  Occasional cough observed.  No audible wheeze.  Temp 97.8 F (36.6 C) (Temporal)   Ht 5\' 11"  (1.803 m)   LMP 02/13/2020 (Approximate)   BMI 19.77 kg/m   Assessment and Plan: 1. COVID-19 -Reviewed ER/UC/follow-up precautions with patient -Discussed symptomatic treatment including over-the-counter analgesics, decongestant -Encouraged good fluid intake -Reviewed quarantine guidelines  2. Cough - HYDROcodone-homatropine (HYCODAN) 5-1.5 MG/5ML syrup; Take 5 mLs by  mouth every 8 (eight) hours as needed for cough.  Dispense: 120 mL; Refill: 0   02/15/2020, FNP-BC  [facility], [facility]  02/21/2020 5:02 PM   Follow Up Instructions:    I discussed the assessment and treatment plan with the patient. The patient was provided an opportunity to ask questions and all were answered. The patient agreed with the plan and demonstrated an understanding of the instructions.   The patient was advised to call back or seek an in-person evaluation if the symptoms worsen or if the condition fails to improve as anticipated.   02/23/2020, FNP

## 2020-02-21 NOTE — Telephone Encounter (Signed)
Noted  

## 2020-02-21 NOTE — Telephone Encounter (Signed)
Patient called stating that she did a home covid test Friday and it was positive. Patient requested that medication be called in. Advised patient that she would need a virtual visit if she needed medication sent in. Patient stated that she went to [place] and came home on 02/11/20. Patient stated that she started getting sick on 02/12/20. Patient stated that she has a cough a little SOB, sneezing and chest sore when she coughs.Paitent stated when she takes a deep breath it causes her to cough. Patient stated that she has been in quarantine. Patient stated that she has not had her covid vaccines.  Patient declined going to an urgent care and requested a virtual visit with Deboraha Sprang NP. Appointment scheduled with Debbie today 02/21/20 at 4:15 pm. Patient was given ER precautions and she verbalized understanding.

## 2020-02-23 ENCOUNTER — Telehealth: Payer: Self-pay

## 2020-02-23 ENCOUNTER — Other Ambulatory Visit: Payer: Self-pay | Admitting: Family Medicine

## 2020-02-23 DIAGNOSIS — R059 Cough, unspecified: Secondary | ICD-10-CM

## 2020-02-23 MED ORDER — HYDROCODONE-HOMATROPINE 5-1.5 MG/5ML PO SYRP: 5.0000 mL | ORAL_SOLUTION | Freq: Three times a day (TID) | ORAL | 0 refills | Status: DC | PRN

## 2020-02-23 NOTE — Telephone Encounter (Signed)
Called and spoke with patient.  She reports that she is feeling better today.  Cough medicine prescription resent to [facility] per patient request.

## 2020-02-23 NOTE — Telephone Encounter (Signed)
Person calling did not leave a name from [facility] [address]; hycodan is not available and request different cough med. If you want pt to have hycodan I spoke with pharmacist at [facility] [place] and they have hycodan available.

## 2020-03-06 DIAGNOSIS — F411 Generalized anxiety disorder: Secondary | ICD-10-CM | POA: Diagnosis not present

## 2020-03-13 DIAGNOSIS — F411 Generalized anxiety disorder: Secondary | ICD-10-CM | POA: Diagnosis not present

## 2020-03-20 DIAGNOSIS — F411 Generalized anxiety disorder: Secondary | ICD-10-CM | POA: Diagnosis not present

## 2020-03-27 DIAGNOSIS — F411 Generalized anxiety disorder: Secondary | ICD-10-CM | POA: Diagnosis not present

## 2020-04-17 DIAGNOSIS — F411 Generalized anxiety disorder: Secondary | ICD-10-CM | POA: Diagnosis not present

## 2020-04-24 DIAGNOSIS — F411 Generalized anxiety disorder: Secondary | ICD-10-CM | POA: Diagnosis not present

## 2020-05-01 DIAGNOSIS — F411 Generalized anxiety disorder: Secondary | ICD-10-CM | POA: Diagnosis not present

## 2020-05-08 DIAGNOSIS — F411 Generalized anxiety disorder: Secondary | ICD-10-CM | POA: Diagnosis not present

## 2020-05-15 DIAGNOSIS — F411 Generalized anxiety disorder: Secondary | ICD-10-CM | POA: Diagnosis not present

## 2020-05-16 ENCOUNTER — Other Ambulatory Visit: Payer: Self-pay | Admitting: Family Medicine

## 2020-05-16 ENCOUNTER — Telehealth: Payer: Self-pay | Admitting: Family Medicine

## 2020-05-16 DIAGNOSIS — E039 Hypothyroidism, unspecified: Secondary | ICD-10-CM

## 2020-05-16 NOTE — Telephone Encounter (Signed)
New message   1. Which medications need to be refilled? (please list name of each medication and dose if known) EUTHYROX 50 MCG tablet  2. Which pharmacy/location (including street and city if local pharmacy) is medication to be sent to? [facility] on [address]

## 2020-05-17 NOTE — Telephone Encounter (Signed)
Pharmacy requests refill on: Euthyrox 50 mcg  LAST REFILL: 11/04/2019 LAST OV: 06/16/2019 NEXT OV: Not Scheduled PHARMACY: [id number] [place], [place]  TSH(06/16/2019): 3.6

## 2020-05-22 DIAGNOSIS — F411 Generalized anxiety disorder: Secondary | ICD-10-CM | POA: Diagnosis not present

## 2020-05-29 DIAGNOSIS — F411 Generalized anxiety disorder: Secondary | ICD-10-CM | POA: Diagnosis not present

## 2020-06-05 DIAGNOSIS — F411 Generalized anxiety disorder: Secondary | ICD-10-CM | POA: Diagnosis not present

## 2020-06-19 DIAGNOSIS — F411 Generalized anxiety disorder: Secondary | ICD-10-CM | POA: Diagnosis not present

## 2020-07-03 DIAGNOSIS — F411 Generalized anxiety disorder: Secondary | ICD-10-CM | POA: Diagnosis not present

## 2020-07-10 DIAGNOSIS — F411 Generalized anxiety disorder: Secondary | ICD-10-CM | POA: Diagnosis not present

## 2020-07-17 DIAGNOSIS — F411 Generalized anxiety disorder: Secondary | ICD-10-CM | POA: Diagnosis not present

## 2020-07-24 DIAGNOSIS — F411 Generalized anxiety disorder: Secondary | ICD-10-CM | POA: Diagnosis not present

## 2020-07-27 ENCOUNTER — Telehealth: Payer: Self-pay | Admitting: Family Medicine

## 2020-07-27 DIAGNOSIS — E039 Hypothyroidism, unspecified: Secondary | ICD-10-CM

## 2020-07-27 NOTE — Telephone Encounter (Signed)
Pt called wanted to know if she can get enough thyroid medication until she sees Dr. Milinda Antis on 2/18

## 2020-07-28 MED ORDER — LEVOTHYROXINE SODIUM 50 MCG PO TABS: ORAL_TABLET | ORAL | 0 refills | Status: DC

## 2020-07-28 NOTE — Telephone Encounter (Signed)
30 day supply sent to pharmacy until her appt with PCP

## 2020-08-11 ENCOUNTER — Ambulatory Visit: Payer: BC Managed Care – PPO | Admitting: Family Medicine

## 2020-08-18 ENCOUNTER — Ambulatory Visit: Payer: BC Managed Care – PPO | Admitting: Family Medicine

## 2020-08-25 ENCOUNTER — Ambulatory Visit: Payer: BC Managed Care – PPO | Admitting: Family Medicine

## 2020-08-25 ENCOUNTER — Encounter: Payer: Self-pay | Admitting: Family Medicine

## 2020-08-25 ENCOUNTER — Other Ambulatory Visit: Payer: Self-pay

## 2020-08-25 VITALS — BP 120/80 | HR 84 | Temp 97.3°F | Ht 70.0 in | Wt 137.2 lb

## 2020-08-25 DIAGNOSIS — D518 Other vitamin B12 deficiency anemias: Secondary | ICD-10-CM

## 2020-08-25 DIAGNOSIS — Z720 Tobacco use: Secondary | ICD-10-CM | POA: Diagnosis not present

## 2020-08-25 DIAGNOSIS — Z789 Other specified health status: Secondary | ICD-10-CM

## 2020-08-25 DIAGNOSIS — R198 Other specified symptoms and signs involving the digestive system and abdomen: Secondary | ICD-10-CM | POA: Insufficient documentation

## 2020-08-25 DIAGNOSIS — Z7289 Other problems related to lifestyle: Secondary | ICD-10-CM | POA: Diagnosis not present

## 2020-08-25 DIAGNOSIS — G4700 Insomnia, unspecified: Secondary | ICD-10-CM

## 2020-08-25 DIAGNOSIS — R0989 Other specified symptoms and signs involving the circulatory and respiratory systems: Secondary | ICD-10-CM

## 2020-08-25 DIAGNOSIS — R7989 Other specified abnormal findings of blood chemistry: Secondary | ICD-10-CM | POA: Diagnosis not present

## 2020-08-25 DIAGNOSIS — E538 Deficiency of other specified B group vitamins: Secondary | ICD-10-CM | POA: Diagnosis not present

## 2020-08-25 DIAGNOSIS — E038 Other specified hypothyroidism: Secondary | ICD-10-CM | POA: Diagnosis not present

## 2020-08-25 DIAGNOSIS — R09A2 Foreign body sensation, throat: Secondary | ICD-10-CM | POA: Insufficient documentation

## 2020-08-25 LAB — CBC WITH DIFFERENTIAL/PLATELET
Basophils Absolute: 0 10*3/uL (ref 0.0–0.1)
Basophils Relative: 0.6 % (ref 0.0–3.0)
Eosinophils Absolute: 0.2 10*3/uL (ref 0.0–0.7)
Eosinophils Relative: 2.3 % (ref 0.0–5.0)
HCT: 42.9 % (ref 36.0–46.0)
Hemoglobin: 13.9 g/dL (ref 12.0–15.0)
Lymphocytes Relative: 20.1 % (ref 12.0–46.0)
Lymphs Abs: 1.7 10*3/uL (ref 0.7–4.0)
MCHC: 32.4 g/dL (ref 30.0–36.0)
MCV: 93.6 fl (ref 78.0–100.0)
Monocytes Absolute: 0.6 10*3/uL (ref 0.1–1.0)
Monocytes Relative: 6.8 % (ref 3.0–12.0)
Neutro Abs: 5.8 10*3/uL (ref 1.4–7.7)
Neutrophils Relative %: 70.2 % (ref 43.0–77.0)
Platelets: 242 10*3/uL (ref 150.0–400.0)
RBC: 4.58 Mil/uL (ref 3.87–5.11)
RDW: 16.5 % — ABNORMAL HIGH (ref 11.5–15.5)
WBC: 8.2 10*3/uL (ref 4.0–10.5)

## 2020-08-25 LAB — COMPREHENSIVE METABOLIC PANEL
ALT: 14 U/L (ref 0–35)
AST: 17 U/L (ref 0–37)
Albumin: 3.7 g/dL (ref 3.5–5.2)
Alkaline Phosphatase: 59 U/L (ref 39–117)
BUN: 12 mg/dL (ref 6–23)
CO2: 28 mEq/L (ref 19–32)
Calcium: 9.6 mg/dL (ref 8.4–10.5)
Chloride: 104 mEq/L (ref 96–112)
Creatinine, Ser: 0.82 mg/dL (ref 0.40–1.20)
GFR: 88.43 mL/min (ref >60.00)
Glucose, Bld: 105 mg/dL — ABNORMAL HIGH (ref 70–99)
Potassium: 4.6 mEq/L (ref 3.5–5.1)
Sodium: 143 mEq/L (ref 135–145)
Total Bilirubin: 0.3 mg/dL (ref 0.2–1.2)
Total Protein: 6.4 g/dL (ref 6.0–8.3)

## 2020-08-25 LAB — TSH: TSH: 2.04 u[IU]/mL (ref 0.35–4.50)

## 2020-08-25 LAB — VITAMIN D 25 HYDROXY (VIT D DEFICIENCY, FRACTURES): VITD: 55.6 ng/mL (ref 30.00–100.00)

## 2020-08-25 LAB — VITAMIN B12: Vitamin B-12: 747 pg/mL (ref 211–911)

## 2020-08-25 MED ORDER — OMEPRAZOLE 20 MG PO CPDR: 20.0000 mg | DELAYED_RELEASE_CAPSULE | Freq: Two times a day (BID) | ORAL | 3 refills | Status: DC

## 2020-08-25 NOTE — Assessment & Plan Note (Signed)
Lab today  Takes B12 (? How much) and mvi

## 2020-08-25 NOTE — Assessment & Plan Note (Signed)
B12 level today  Takes supplement (? How much) and mvi

## 2020-08-25 NOTE — Assessment & Plan Note (Signed)
Hypothyroidism  Pt has no clinical changes No change in energy level/ hair or skin/ edema and no tremor Lab Results  Component Value Date   TSH 3.60 06/16/2019    tsh pending today Will change dose of levothyroxine if needed Currently takes 50 mcg daily

## 2020-08-25 NOTE — Patient Instructions (Addendum)
Take omeprazole 20 mg twice daily  Watch diet for things that trigger you   If not improved (globus sensation in throat) in 2 weeks please let us know If worse let us know earlier  Watch for cough or breathing problems   Labs today  We will send in thyroid meds once we get labs back   Keep thinking about quitting smoking and cutting alcohol   Take care of yourself

## 2020-08-25 NOTE — Assessment & Plan Note (Signed)
Past h/o abuse  Has quit in past w/o withdrawal  Drinks 2-3 glasses of wine daily  Plans to quit later, stress is high in middle of a separation right now

## 2020-08-25 NOTE — Progress Notes (Signed)
Subjective:    Patient ID: Nicole [name], female    DOB: Jul 01, 1980, 40 y.o.   MRN: [id number]  This visit occurred during the SARS-CoV-2 public health emergency.  Safety protocols were in place, including screening questions prior to the visit, additional usage of staff PPE, and extensive cleaning of exam room while observing appropriate contact time as indicated for disinfecting solutions.    HPI 40 yo pt of NP Leone Payor presents to transfer care   Wt Readings from Last 3 Encounters:  08/25/20 137 lb 3 oz (62.2 kg)  06/16/19 141 lb 12 oz (64.3 kg)  03/08/19 136 lb 12.8 oz (62.1 kg)   19.68 kg/m  Pt c/o feeling of lump in throat Feels like neck/throat/ln are swollen  Affects her swallowing - a little uncomfortable  Has been taking nutrifol caps (pretty big) and just started them   Feeling ok otherwise   Takes omeprazole otc for GERD (dissolves)  She thinks 20 mg   occ takes nsaids/not all the time   No n/v   etoh - past abuse  Right now 2-3 glasses of wine per night  Not binging  No h/o withdrawal -able to stop cold [place] in the past  She would like to quit  A lot of stress- just separated from her husband   (not a good time to quit)   Current smoker 1ppd  Not a good time to quit   H/o hypothyroidism Hypothyroidism  Pt has no clinical changes No change in energy level/ hair or skin/ edema and no tremor Lab Results  Component Value Date   TSH 3.60 06/16/2019    Takes levothyroxine 50 mcg daily   Chronic insomnia  Trazodone never helped much   B12 deficiency Lab Results  Component Value Date   VITAMINB12 1,166 (H) 01/31/2017   Takes vitamin B12 orally -and MVI gummies  Unsure of dose   Low vitamin D in the past -not taking any now   Past anemia -resolved at last check  Lab Results  Component Value Date   WBC 8.0 06/16/2019   HGB 14.1 06/16/2019   HCT 43.7 06/16/2019   MCV 93.2 06/16/2019   PLT 277.0 06/16/2019   Patient Active Problem List    Diagnosis Date Noted  . Insomnia 08/25/2020  . Alcohol use 08/25/2020  . B12 deficiency anemia 12/04/2015  . Noninfectious diarrhea   . Vitamin B12 deficiency 10/25/2015  . Hypothyroidism 10/25/2015  . Alcohol abuse 10/25/2015  . Nonspecific abnormal electrocardiogram (ECG) (EKG) 10/12/2015  . Symptomatic anemia 10/12/2015  . Tobacco abuse 06/01/2014  . History of nephrolithiasis 06/01/2014  . Low serum vitamin D 06/01/2014   Past Medical History:  Diagnosis Date  . Anemia   . Dysrhythmia    Tacchy in HS.  Irregular with anemia.  Better now  . Hypothyroidism   . Kidney stones   . Migraines   . UTI (urinary tract infection)    Past Surgical History:  Procedure Laterality Date  . COLONOSCOPY WITH PROPOFOL N/A 11/17/2015   Procedure: COLONOSCOPY WITH PROPOFOL;  Surgeon: Midge Minium, MD;  Location: [facility];  Service: Endoscopy;  Laterality: N/A;  . WISDOM TOOTH EXTRACTION     Social History   Tobacco Use  . Smoking status: Current Every Day Smoker    Packs/day: 1.00    Years: 20.00    Pack years: 20.00    Types: Cigarettes  . Smokeless tobacco: Never Used  Substance Use Topics  . Alcohol use: Not  Currently    Comment: HIstory 3 drinks per day, stopped 9/19; recently quit drinking as of 08/2018  . Drug use: No   Family History  Problem Relation Age of Onset  . Hypertension Mother   . Bladder Cancer Maternal Grandmother    No Known Allergies Current Outpatient Medications on File Prior to Visit  Medication Sig Dispense Refill  . Cyanocobalamin (VITAMIN B-12 PO) Take by mouth daily.    Marland Kitchen doxylamine, Sleep, (UNISOM) 25 MG tablet Take 25 mg by mouth at bedtime as needed.    Marland Kitchen FOLIC ACID PO Take by mouth daily.    Marland Kitchen levothyroxine (EUTHYROX) 50 MCG tablet TAKE 1 TABLET BY MOUTH ONCE DAILY BEFORE BREAKFAST 30 tablet 0  . Multiple Vitamin (MULTIVITAMIN WITH MINERALS) TABS tablet Take 1 tablet by mouth daily. 30 tablet 0   No current facility-administered  medications on file prior to visit.     Review of Systems  Constitutional: Positive for fatigue. Negative for activity change, appetite change, fever and unexpected weight change.  HENT: Negative for congestion, ear pain, rhinorrhea, sinus pressure, sore throat and voice change.        Feels like a blob in throat   No goiter   Eyes: Negative for pain, redness and visual disturbance.  Respiratory: Negative for cough, shortness of breath and wheezing.        Clearing throat   Cardiovascular: Negative for chest pain and palpitations.  Gastrointestinal: Negative for abdominal pain, blood in stool, constipation and diarrhea.  Endocrine: Negative for polydipsia and polyuria.  Genitourinary: Negative for dysuria, frequency and urgency.  Musculoskeletal: Negative for arthralgias, back pain and myalgias.  Skin: Negative for pallor and rash.  Allergic/Immunologic: Negative for environmental allergies.  Neurological: Negative for dizziness, syncope and headaches.  Hematological: Negative for adenopathy. Does not bruise/bleed easily.  Psychiatric/Behavioral: Negative for decreased concentration and dysphoric mood. The patient is not nervous/anxious.        Objective:   Physical Exam Constitutional:      General: She is not in acute distress.    Appearance: Normal appearance. She is well-developed, normal weight and well-nourished. She is not ill-appearing.  HENT:     Head: Normocephalic and atraumatic.     Right Ear: Tympanic membrane, ear canal and external ear normal.     Left Ear: Tympanic membrane, ear canal and external ear normal.     Nose: Nose normal.     Mouth/Throat:     Mouth: Oropharynx is clear and moist. Mucous membranes are moist.     Pharynx: Oropharynx is clear. No oropharyngeal exudate or posterior oropharyngeal erythema.  Eyes:     Extraocular Movements: EOM normal.     Conjunctiva/sclera: Conjunctivae normal.     Pupils: Pupils are equal, round, and reactive to light.   Neck:     Thyroid: No thyromegaly.     Vascular: No carotid bruit or JVD.  Cardiovascular:     Rate and Rhythm: Normal rate and regular rhythm.     Pulses: Intact distal pulses.     Heart sounds: Normal heart sounds. No gallop.   Pulmonary:     Effort: Pulmonary effort is normal. No respiratory distress.     Breath sounds: Normal breath sounds. No wheezing or rales.     Comments: No crackles Abdominal:     General: Bowel sounds are normal. There is no distension or abdominal bruit.     Palpations: Abdomen is soft. There is no mass.     Tenderness:  There is no abdominal tenderness. There is no guarding or rebound.     Hernia: No hernia is present.     Comments: No HSM  Musculoskeletal:        General: No edema.     Cervical back: Normal range of motion and neck supple.     Right lower leg: No edema.     Left lower leg: No edema.  Lymphadenopathy:     Cervical: No cervical adenopathy.  Skin:    General: Skin is warm and dry.     Coloration: Skin is not jaundiced or pale.     Findings: No rash.  Neurological:     Mental Status: She is alert.     Sensory: No sensory deficit.     Coordination: Coordination normal.     Deep Tendon Reflexes: Reflexes are normal and symmetric. Reflexes normal.     Comments: No tremor   Psychiatric:        Mood and Affect: Mood and affect and mood normal.           Assessment & Plan:   Problem List Items Addressed This Visit      Endocrine   Hypothyroidism - Primary    Hypothyroidism  Pt has no clinical changes No change in energy level/ hair or skin/ edema and no tremor Lab Results  Component Value Date   TSH 3.60 06/16/2019    tsh pending today Will change dose of levothyroxine if needed Currently takes 50 mcg daily       Relevant Orders   TSH     Other   Tobacco abuse    Disc in detail risks of smoking and possible outcomes including copd, vascular/ heart disease, cancer , respiratory and sinus infections  Pt voices  understanding  At 1ppd  Not ready to quit      Vitamin B12 deficiency    Lab today  Takes B12 (? How much) and mvi      Relevant Orders   Vitamin B12   B12 deficiency anemia    B12 level today  Takes supplement (? How much) and mvi      Relevant Orders   Vitamin B12   CBC with Differential/Platelet   Insomnia    Trazodone did not help  Does drink etoh unisom prn      Alcohol use    Past h/o abuse  Has quit in past w/o withdrawal  Drinks 2-3 glasses of wine daily  Plans to quit later, stress is high in middle of a separation right now       Relevant Orders   Comprehensive metabolic panel   Globus sensation    In setting of uncontrolled GERD Px omeprazole 20 mg bid  Update if not improved/resolved in 2 wk  Low threshold for GI eval or cxr  Smoker and also past etoh abuse      RESOLVED: Low serum vitamin D   Relevant Orders   VITAMIN D 25 Hydroxy (Vit-D Deficiency, Fractures)

## 2020-08-25 NOTE — Assessment & Plan Note (Signed)
In setting of uncontrolled GERD Px omeprazole 20 mg bid  Update if not improved/resolved in 2 wk  Low threshold for GI eval or cxr  Smoker and also past etoh abuse

## 2020-08-25 NOTE — Assessment & Plan Note (Deleted)
Vit D level today  No supplementation currently except mvi No h/o OP

## 2020-08-25 NOTE — Assessment & Plan Note (Signed)
Disc in detail risks of smoking and possible outcomes including copd, vascular/ heart disease, cancer , respiratory and sinus infections  Pt voices understanding  At 1ppd  Not ready to quit

## 2020-08-25 NOTE — Assessment & Plan Note (Signed)
Trazodone did not help  Does drink etoh unisom prn

## 2020-09-06 ENCOUNTER — Other Ambulatory Visit: Payer: Self-pay | Admitting: Family Medicine

## 2020-09-06 DIAGNOSIS — E039 Hypothyroidism, unspecified: Secondary | ICD-10-CM

## 2020-09-07 NOTE — Telephone Encounter (Signed)
Pharmacy requests refill on: Euthyrox 50 mcg   LAST REFILL: 07/28/2020 (Q-30, R-0) LAST OV: 08/25/2020 NEXT OV: Not Scheduled  PHARMACY: [id number] [place], [place]  TSH (08/25/2020): 2.04

## 2021-02-05 ENCOUNTER — Other Ambulatory Visit: Payer: Self-pay

## 2021-02-05 ENCOUNTER — Ambulatory Visit: Payer: 59 | Admitting: Family Medicine

## 2021-02-05 ENCOUNTER — Encounter: Payer: Self-pay | Admitting: Family Medicine

## 2021-02-05 VITALS — BP 124/68 | HR 65 | Temp 97.8°F | Ht 70.0 in | Wt 134.4 lb

## 2021-02-05 DIAGNOSIS — N3000 Acute cystitis without hematuria: Secondary | ICD-10-CM | POA: Insufficient documentation

## 2021-02-05 DIAGNOSIS — R350 Frequency of micturition: Secondary | ICD-10-CM

## 2021-02-05 LAB — POC URINALSYSI DIPSTICK (AUTOMATED)
Blood, UA: 200
Spec Grav, UA: >=1.03 — AB (ref 1.010–1.025)
pH, UA: 5 (ref 5.0–8.0)

## 2021-02-05 LAB — POCT UA - MICROSCOPIC ONLY

## 2021-02-05 MED ORDER — CEPHALEXIN 500 MG PO CAPS: 500.0000 mg | ORAL_CAPSULE | Freq: Two times a day (BID) | ORAL | 0 refills | Status: DC

## 2021-02-05 NOTE — Assessment & Plan Note (Signed)
Per pt-has had before usually after sexual activity  Enc her to drink water and urinate pre and post if possible  Taking azo  Culture pending tx with keflex 500 mg bid 7 d (5 d if better) inst to call if suddenly worse  Update if not starting to improve in a week or if worsening

## 2021-02-05 NOTE — Patient Instructions (Addendum)
Drink water  Take the antibiotic (keflex) as directed   Try to empty bladder before and after intercourse   If not improved let us know  We will call when culture returns

## 2021-02-05 NOTE — Progress Notes (Signed)
Subjective:    Patient ID: Nicole [name], female    DOB: 27-Jul-1980, 40 y.o.   MRN: [id number]  This visit occurred during the SARS-CoV-2 public health emergency.  Safety protocols were in place, including screening questions prior to the visit, additional usage of staff PPE, and extensive cleaning of exam room while observing appropriate contact time as indicated for disinfecting solutions.   HPI Pt presents with urinary symptoms   Wt Readings from Last 3 Encounters:  02/05/21 134 lb 6 oz (61 kg)  08/25/20 137 lb 3 oz (62.2 kg)  06/16/19 141 lb 12 oz (64.3 kg)   19.28 kg/m  She gets occ uti   After sex she feels irritated and takes azo and is usually goes away   This time tried azo and fluids  Not getting better Tingling sensation after urination  Feels urge and then tiny volume  Frequency and urgency No nausea or fever  No blood that she could see     Had kidney stones with a pregnancy Had a stent  More prone to uti since then   Results for orders placed or performed in visit on 02/05/21  POCT Urinalysis Dipstick (Automated)  Result Value Ref Range   Color, UA Orange    Clarity, UA Hazy    Glucose, UA     Bilirubin, UA     Ketones, UA     Spec Grav, UA >=1.030 (A) 1.010 - 1.025   Blood, UA 200 Ery/uL    pH, UA 5.0 5.0 - 8.0   Protein, UA     Urobilinogen, UA     Nitrite, UA     Leukocytes, UA    POCT UA - Microscopic Only  Result Value Ref Range   WBC, Ur, HPF, POC 2-4 0 - 5   RBC, Urine, Miroscopic 1-2 0 - 2   Bacteria, U Microscopic mod None - Trace   Mucus, UA few    Epithelial cells, urine per micros 0-2    Crystals, Ur, HPF, POC 0-1    Casts, Ur, LPF, POC none    Yeast, UA none      Patient Active Problem List   Diagnosis Date Noted   Acute cystitis 02/05/2021   Insomnia 08/25/2020   Alcohol use 08/25/2020   Globus sensation 08/25/2020   B12 deficiency anemia 12/04/2015   Noninfectious diarrhea    Vitamin B12 deficiency 10/25/2015    Hypothyroidism 10/25/2015   Alcohol abuse 10/25/2015   Nonspecific abnormal electrocardiogram (ECG) (EKG) 10/12/2015   Symptomatic anemia 10/12/2015   Tobacco abuse 06/01/2014   History of nephrolithiasis 06/01/2014   Past Medical History:  Diagnosis Date   Anemia    Dysrhythmia    Tacchy in HS.  Irregular with anemia.  Better now   Hypothyroidism    Kidney stones    Migraines    UTI (urinary tract infection)    Past Surgical History:  Procedure Laterality Date   COLONOSCOPY WITH PROPOFOL N/A 11/17/2015   Procedure: COLONOSCOPY WITH PROPOFOL;  Surgeon: Midge Minium, MD;  Location: [facility];  Service: Endoscopy;  Laterality: N/A;   WISDOM TOOTH EXTRACTION     Social History   Tobacco Use   Smoking status: Every Day    Packs/day: 1.00    Years: 20.00    Pack years: 20.00    Types: Cigarettes   Smokeless tobacco: Never  Substance Use Topics   Alcohol use: Not Currently    Comment: HIstory 3 drinks per day,  stopped 9/19; recently quit drinking as of 08/2018   Drug use: No   Family History  Problem Relation Age of Onset   Hypertension Mother    Bladder Cancer Maternal Grandmother    No Known Allergies Current Outpatient Medications on File Prior to Visit  Medication Sig Dispense Refill   Cyanocobalamin (VITAMIN B-12 PO) Take by mouth daily.     doxylamine, Sleep, (UNISOM) 25 MG tablet Take 25 mg by mouth at bedtime as needed.     EUTHYROX 50 MCG tablet TAKE 1 TABLET BY MOUTH ONCE DAILY BEFORE BREAKFAST 90 tablet 1   FOLIC ACID PO Take by mouth daily.     Multiple Vitamin (MULTIVITAMIN WITH MINERALS) TABS tablet Take 1 tablet by mouth daily. 30 tablet 0   omeprazole (PRILOSEC) 20 MG capsule Take 1 capsule (20 mg total) by mouth 2 (two) times daily before a meal. 60 capsule 3   No current facility-administered medications on file prior to visit.     Review of Systems  Constitutional:  Negative for activity change, appetite change, fatigue, fever and unexpected  weight change.  HENT:  Negative for congestion, ear pain, rhinorrhea, sinus pressure and sore throat.   Eyes:  Negative for pain, redness and visual disturbance.  Respiratory:  Negative for cough, shortness of breath and wheezing.   Cardiovascular:  Negative for chest pain and palpitations.  Gastrointestinal:  Negative for abdominal pain, blood in stool, constipation, diarrhea, nausea and vomiting.  Endocrine: Negative for polydipsia and polyuria.  Genitourinary:  Positive for dysuria, frequency and urgency. Negative for flank pain, hematuria and pelvic pain.  Musculoskeletal:  Negative for arthralgias, back pain and myalgias.  Skin:  Negative for pallor and rash.  Allergic/Immunologic: Negative for environmental allergies.  Neurological:  Negative for dizziness, syncope and headaches.  Hematological:  Negative for adenopathy. Does not bruise/bleed easily.  Psychiatric/Behavioral:  Negative for decreased concentration and dysphoric mood. The patient is not nervous/anxious.       Objective:   Physical Exam Constitutional:      General: She is not in acute distress.    Appearance: Normal appearance. She is well-developed and normal weight.  HENT:     Head: Normocephalic and atraumatic.  Eyes:     Conjunctiva/sclera: Conjunctivae normal.     Pupils: Pupils are equal, round, and reactive to light.  Neck:     Thyroid: No thyromegaly.     Vascular: No carotid bruit or JVD.  Cardiovascular:     Rate and Rhythm: Normal rate and regular rhythm.     Heart sounds: Normal heart sounds.    No gallop.  Pulmonary:     Effort: Pulmonary effort is normal. No respiratory distress.     Breath sounds: Normal breath sounds. No wheezing or rales.  Abdominal:     General: Bowel sounds are normal. There is no distension or abdominal bruit.     Palpations: Abdomen is soft. There is no mass.     Tenderness: There is no abdominal tenderness. There is no right CVA tenderness or left CVA tenderness.      Comments: No suprapubic tenderness or fullness     Musculoskeletal:     Cervical back: Normal range of motion and neck supple.     Right lower leg: No edema.     Left lower leg: No edema.  Lymphadenopathy:     Cervical: No cervical adenopathy.  Skin:    General: Skin is warm and dry.     Coloration: Skin is  not pale.     Findings: No rash.  Neurological:     Mental Status: She is alert.     Coordination: Coordination normal.     Deep Tendon Reflexes: Reflexes are normal and symmetric. Reflexes normal.  Psychiatric:        Mood and Affect: Mood normal.          Assessment & Plan:   Problem List Items Addressed This Visit       Genitourinary   Acute cystitis - Primary    Per pt-has had before usually after sexual activity  Enc her to drink water and urinate pre and post if possible  Taking azo  Culture pending tx with keflex 500 mg bid 7 d (5 d if better) inst to call if suddenly worse  Update if not starting to improve in a week or if worsening        Relevant Orders   Urine Culture   POCT UA - Microscopic Only (Completed)   Other Visit Diagnoses     Urinary frequency       Relevant Orders   POCT Urinalysis Dipstick (Automated) (Completed)   POCT UA - Microscopic Only (Completed)

## 2021-02-07 LAB — URINE CULTURE
MICRO NUMBER:: 12243235
Result:: NO GROWTH
SPECIMEN QUALITY:: ADEQUATE

## 2021-03-09 ENCOUNTER — Other Ambulatory Visit: Payer: Self-pay | Admitting: Family Medicine

## 2021-03-09 DIAGNOSIS — E039 Hypothyroidism, unspecified: Secondary | ICD-10-CM

## 2021-07-19 ENCOUNTER — Telehealth: Payer: Self-pay

## 2021-07-19 NOTE — Telephone Encounter (Signed)
That is fine with me but they need to let the pt know

## 2021-07-19 NOTE — Telephone Encounter (Signed)
Received fax from [facility] stating that Euthyrox is not covered. May they change to a new manufacturer?

## 2021-07-19 NOTE — Telephone Encounter (Signed)
Pharmacy advised via fax.

## 2021-09-06 ENCOUNTER — Other Ambulatory Visit: Payer: Self-pay | Admitting: Family Medicine

## 2021-10-12 ENCOUNTER — Ambulatory Visit: Payer: 59 | Admitting: Family Medicine

## 2021-10-12 ENCOUNTER — Encounter: Payer: Self-pay | Admitting: Family Medicine

## 2021-10-12 ENCOUNTER — Ambulatory Visit (INDEPENDENT_AMBULATORY_CARE_PROVIDER_SITE_OTHER): Payer: 59 | Admitting: Family Medicine

## 2021-10-12 DIAGNOSIS — L719 Rosacea, unspecified: Secondary | ICD-10-CM | POA: Insufficient documentation

## 2021-10-12 DIAGNOSIS — L609 Nail disorder, unspecified: Secondary | ICD-10-CM | POA: Diagnosis not present

## 2021-10-12 MED ORDER — DOXYCYCLINE HYCLATE 100 MG PO TABS: 100.0000 mg | ORAL_TABLET | Freq: Every day | ORAL | 1 refills | Status: DC

## 2021-10-12 NOTE — Patient Instructions (Addendum)
Wear sun protection all the time  ? ?Start doxycycline (antibiotic)  ? ?I placed a referral for dermatology  ?You will get a call  ?If you don't hear in 2 weeks please call us back  ? ?Wash with cetaphil cleanser instead of what you use  ? ?If make up is old/ swap it out  ?Keep everything clean  ? ? ? ? ? ? ? ?

## 2021-10-12 NOTE — Assessment & Plan Note (Signed)
R great toe nail is thick (has polish) w/o h/o trauma ?Tried a topical ?Not hurting  ?Does not go to nail salons  ? ?Ref to dermatology for further eval ?Needs to r/o fungal infection  ?H/o alcohol use/unsure if lamasil would be an option ?

## 2021-10-12 NOTE — Assessment & Plan Note (Signed)
Recent outbreak on cheeks with assoc redness/easy to flush and some pustules  ?Given distribution- possibly acne rosacea  ?Handout given  ?Lifestyle habits reviewed/enc sunscreen , disc alcohol ?Recommend gentle cleanser and sunscreen use  ?Px doxycycline 100 mg daily (with non dairy food)  ?Update if worse  ?Ref done to dermatology ?

## 2021-10-12 NOTE — Progress Notes (Signed)
? ?Subjective:  ? ? Patient ID: Nicole [name], female    DOB: 1980-08-12, 41 y.o.   MRN: [id number] ? ?HPI ?Pt presents with c/o acne ?Also thick toe nail  ? ?Wt Readings from Last 3 Encounters:  ?10/12/21 134 lb 6 oz (61 kg)  ?02/05/21 134 lb 6 oz (61 kg)  ?08/25/20 137 lb 3 oz (62.2 kg)  ? ?19.28 kg/m? ? ?Started with facial acne 4-6 mo ago  ?Started small  ?Developed a larger cyst like area on L cheek  ?Then R  ? ?Mostly cheeks , little on forehead  ?She does flush Fatima Sanger  ?No flaky skin  ?Oily complexion  ? ?Some pustules  ? ?Had acne as a teen /was not really treated /but did have acne surgery at least once  ?? If had antibiotics , did have retin a once  ? ?No change in medicines ?? If any hormone changes  ?Still has periods /not very reguarly but that is nl for her  ?No chance pregnant  ? ?No contraception  ?Is not trying to get pregnant  ? ? ?Washes with dove Carla Drape spring soap  ?No moisturizers  ?Make up- rarely wears it / twice weekly, it has not changed  ?Cover girl foundation    ? ?Differin gel otc- for less than a month/ helping a little  ?Spot dots  ?Does not touch her face  ?Not in the sun  ?Lot of stress ? ?Also has a toe nail fungus ?    3-4 months / does not hurt  ?R great toe -is thick and white in color  ?Looks like normal nail behind it  ?Used a topical  ? ? ?Patient Active Problem List  ? Diagnosis Date Noted  ? Acne rosacea 10/12/2021  ? Nail abnormality 10/12/2021  ? Acute cystitis 02/05/2021  ? Insomnia 08/25/2020  ? Alcohol use 08/25/2020  ? Globus sensation 08/25/2020  ? B12 deficiency anemia 12/04/2015  ? Noninfectious diarrhea   ? Vitamin B12 deficiency 10/25/2015  ? Hypothyroidism 10/25/2015  ? Alcohol abuse 10/25/2015  ? Nonspecific abnormal electrocardiogram (ECG) (EKG) 10/12/2015  ? Symptomatic anemia 10/12/2015  ? Tobacco abuse 06/01/2014  ? History of nephrolithiasis 06/01/2014  ? ?Past Medical History:  ?Diagnosis Date  ? Anemia   ? Dysrhythmia   ? [place] in HS.  Irregular with  anemia.  Better now  ? Hypothyroidism   ? Kidney stones   ? Migraines   ? UTI (urinary tract infection)   ? ?Past Surgical History:  ?Procedure Laterality Date  ? COLONOSCOPY WITH PROPOFOL N/A 11/17/2015  ? Procedure: COLONOSCOPY WITH PROPOFOL;  Surgeon: Midge Minium, MD;  Location: [facility];  Service: Endoscopy;  Laterality: N/A;  ? WISDOM TOOTH EXTRACTION    ? ?Social History  ? ?Tobacco Use  ? Smoking status: Every Day  ?  Packs/day: 1.00  ?  Years: 20.00  ?  Pack years: 20.00  ?  Types: Cigarettes  ? Smokeless tobacco: Never  ?Substance Use Topics  ? Alcohol use: Not Currently  ?  Comment: HIstory 3 drinks per day, stopped 9/19; recently quit drinking as of 08/2018  ? Drug use: No  ? ?Family History  ?Problem Relation Age of Onset  ? Hypertension Mother   ? Bladder Cancer Maternal Grandmother   ? ?No Known Allergies ?Current Outpatient Medications on File Prior to Visit  ?Medication Sig Dispense Refill  ? Cyanocobalamin (VITAMIN B-12 PO) Take by mouth daily.    ? doxylamine, Sleep, (UNISOM)  25 MG tablet Take 25 mg by mouth at bedtime as needed.    ? FOLIC ACID PO Take by mouth daily.    ? levothyroxine (EUTHYROX) 50 MCG tablet TAKE 1 TABLET BY MOUTH ONCE DAILY BEFORE BREAKFAST 90 tablet 1  ? Multiple Vitamin (MULTIVITAMIN WITH MINERALS) TABS tablet Take 1 tablet by mouth daily. 30 tablet 0  ? omeprazole (PRILOSEC) 20 MG capsule TAKE 1 CAPSULE BY MOUTH TWICE DAILY BEFORE A MEAL 30 capsule 0  ? ?No current facility-administered medications on file prior to visit.  ?  ? ?Review of Systems  ?Constitutional:  Negative for activity change, appetite change, fatigue, fever and unexpected weight change.  ?HENT:  Negative for congestion, ear pain, rhinorrhea, sinus pressure and sore throat.   ?Eyes:  Negative for pain, redness and visual disturbance.  ?Respiratory:  Negative for cough, shortness of breath and wheezing.   ?Cardiovascular:  Negative for chest pain and palpitations.  ?Gastrointestinal:  Negative for  abdominal pain, blood in stool, constipation and diarrhea.  ?Endocrine: Negative for polydipsia and polyuria.  ?Genitourinary:  Negative for dysuria, frequency and urgency.  ?Musculoskeletal:  Negative for arthralgias, back pain and myalgias.  ?Skin:  Positive for color change and rash. Negative for pallor.  ?Allergic/Immunologic: Negative for environmental allergies.  ?Neurological:  Negative for dizziness, syncope and headaches.  ?Hematological:  Negative for adenopathy. Does not bruise/bleed easily.  ?Psychiatric/Behavioral:  Negative for decreased concentration and dysphoric mood. The patient is not nervous/anxious.   ? ?   ?Objective:  ? Physical Exam ?Constitutional:   ?   General: She is not in acute distress. ?   Appearance: Normal appearance. She is normal weight. She is not ill-appearing or diaphoretic.  ?   Comments: slim  ?Eyes:  ?   General:     ?   Right eye: No discharge.     ?   Left eye: No discharge.  ?   Conjunctiva/sclera: Conjunctivae normal.  ?   Pupils: Pupils are equal, round, and reactive to light.  ?Cardiovascular:  ?   Rate and Rhythm: Normal rate.  ?Pulmonary:  ?   Effort: Pulmonary effort is normal. No respiratory distress.  ?Musculoskeletal:  ?   Cervical back: Normal range of motion and neck supple.  ?Lymphadenopathy:  ?   Cervical: No cervical adenopathy.  ?Skin: ?   General: Skin is warm and dry.  ?   Coloration: Skin is not pale.  ?   Comments: Erythematous cheeks/under makeup  ?Comedones with few pustules on cheeks/ few on forehead between eyes  ? ?Few areas are larger/almost cyst like and tender  ? ?R great toe nail is thick but polished  ?Non tender ?No skin changes surrounding it  ? ? ? ?  ?Neurological:  ?   Mental Status: She is alert.  ?Psychiatric:     ?   Mood and Affect: Mood normal.  ? ? ? ? ? ?   ?Assessment & Plan:  ? ?Problem List Items Addressed This Visit   ? ?  ? Musculoskeletal and Integument  ? Acne rosacea  ?  Recent outbreak on cheeks with assoc redness/easy to  flush and some pustules  ?Given distribution- possibly acne rosacea  ?Handout given  ?Lifestyle habits reviewed/enc sunscreen , disc alcohol ?Recommend gentle cleanser and sunscreen use  ?Px doxycycline 100 mg daily (with non dairy food)  ?Update if worse  ?Ref done to dermatology ? ?  ?  ? Relevant Orders  ? Ambulatory referral to  Dermatology  ? Nail abnormality  ?  R great toe nail is thick (has polish) w/o h/o trauma ?Tried a topical ?Not hurting  ?Does not go to nail salons  ? ?Ref to dermatology for further eval ?Needs to r/o fungal infection  ?H/o alcohol use/unsure if lamasil would be an option ? ?  ?  ? Relevant Orders  ? Ambulatory referral to Dermatology  ? ? ? ?

## 2021-10-16 ENCOUNTER — Ambulatory Visit: Payer: 59 | Admitting: Family Medicine

## 2021-10-30 ENCOUNTER — Other Ambulatory Visit: Payer: Self-pay | Admitting: Family Medicine

## 2021-10-30 NOTE — Telephone Encounter (Signed)
Please schedule a follow up this summer  ?

## 2021-10-30 NOTE — Telephone Encounter (Signed)
Pt had a recent acute appt but no recent f/u or CPE and no future appts., please advise  

## 2021-10-31 NOTE — Telephone Encounter (Signed)
Letter mailed letting pt know ?

## 2021-12-28 ENCOUNTER — Emergency Department
Admission: EM | Admit: 2021-12-28 | Discharge: 2021-12-29 | Disposition: A | Discharge status: Home / Self Care | Payer: 59 | Attending: Emergency Medicine | Admitting: Emergency Medicine

## 2021-12-28 ENCOUNTER — Other Ambulatory Visit: Payer: Self-pay

## 2021-12-28 DIAGNOSIS — F1210 Cannabis abuse, uncomplicated: Secondary | ICD-10-CM | POA: Diagnosis not present

## 2021-12-28 DIAGNOSIS — F1410 Cocaine abuse, uncomplicated: Secondary | ICD-10-CM | POA: Diagnosis not present

## 2021-12-28 DIAGNOSIS — R3129 Other microscopic hematuria: Secondary | ICD-10-CM | POA: Insufficient documentation

## 2021-12-28 DIAGNOSIS — F1910 Other psychoactive substance abuse, uncomplicated: Secondary | ICD-10-CM

## 2021-12-28 DIAGNOSIS — T40601A Poisoning by unspecified narcotics, accidental (unintentional), initial encounter: Secondary | ICD-10-CM | POA: Insufficient documentation

## 2021-12-28 MED ORDER — LACTATED RINGERS IV BOLUS
1000.0000 mL | Freq: Once | INTRAVENOUS | Status: AC
  Administered 2021-12-29: 1000 mL via INTRAVENOUS

## 2021-12-28 NOTE — ED Triage Notes (Signed)
Pt with OD from home. Per family pt received CPR and narcan with ems, pt refused ems transport. Pt is very sleepy, family at side. Pt states has been taking oxycodone with etoh tonight.

## 2021-12-28 NOTE — ED Provider Notes (Signed)
[facility] Provider Note    Event Date/Time   First MD Initiated Contact with Patient 12/28/21 2344     (approximate)   History   Drug Overdose   HPI  Nicole [name] is a 41 y.o. female who presents to the ED for evaluation of Drug Overdose   I reviewed PCP visit from 4/21.  History of acne, alcohol abuse.   Patient presents to the ED for the ration of a drug overdose.  Reportedly revived in the field with Narcan and presents to the ED by POV with family.  Patient reports she thinks it is an overreaction and that she does not need to be here.  She reports that she spent the day outside in the sun today, was feeling hot and tired.  Reports having 1 small bottle of wine.  Reports that she took a Percocet that she got from a friend and was not prescribed to see if this would perk her up before she goes out tonight.  Reports she fell asleep and then woke up with EMS and fire around her.  Reports she feels fine right now, somewhat anxious.  This was not intentional, no suicidal intent or coingestions beyond ethanol.   Physical Exam   Triage Vital Signs: ED Triage Vitals  Enc Vitals Group     BP 12/28/21 2341 131/89     Pulse Rate 12/28/21 2341 (!) 130     Resp 12/28/21 2341 16     Temp 12/28/21 2341 98.3 F (36.8 C)     Temp src --      SpO2 12/28/21 2341 95 %     Weight 12/28/21 2342 135 lb (61.2 kg)     Height 12/28/21 2342 5\' 11"  (1.803 m)     Head Circumference --      Peak Flow --      Pain Score 12/28/21 2341 0     Pain Loc --      Pain Edu? --      Excl. in GC? --     Most recent vital signs: Vitals:   12/28/21 2341 12/29/21 0211  BP: 131/89 98/63  Pulse: (!) 130 77  Resp: 16 15  Temp: 98.3 F (36.8 C)   SpO2: 95% 98%    General: Awake, no distress.  Pleasant and conversational. CV:  Good peripheral perfusion.  Tachycardic and regular Resp:  Normal effort.  Abd:  No distention.  Soft and benign MSK:  No deformity noted.   Neuro:  No focal deficits appreciated. Cranial nerves II through XII intact 5/5 strength and sensation in all 4 extremities Other:     ED Results / Procedures / Treatments   Labs (all labs ordered are listed, but only abnormal results are displayed) Labs Reviewed  CBC WITH DIFFERENTIAL/PLATELET - Abnormal; Notable for the following components:      Result Value   Neutro Abs 7.8 (*)    All other components within normal limits  COMPREHENSIVE METABOLIC PANEL - Abnormal; Notable for the following components:   CO2 21 (*)    Calcium 8.4 (*)    All other components within normal limits  URINALYSIS, ROUTINE W REFLEX MICROSCOPIC - Abnormal; Notable for the following components:   Color, Urine YELLOW (*)    APPearance CLOUDY (*)    Hgb urine dipstick LARGE (*)    Ketones, ur 5 (*)    Protein, ur 30 (*)    RBC / HPF >50 (*)  All other components within normal limits  ETHANOL - Abnormal; Notable for the following components:   Alcohol, Ethyl (B) 225 (*)    All other components within normal limits  URINE DRUG SCREEN, QUALITATIVE ([facility] ONLY) - Abnormal; Notable for the following components:   Cocaine Metabolite,Ur [place] POSITIVE (*)    Cannabinoid 50 Ng, Ur [place] POSITIVE (*)    All other components within normal limits  MAGNESIUM  PREGNANCY, URINE    EKG Sinus rhythm with a rate of 97 bpm.  Normal axis and intervals.  No clear signs of acute ischemia.  RADIOLOGY   Official radiology report(s): No results found.  PROCEDURES and INTERVENTIONS:  .1-3 Lead EKG Interpretation  Performed by: Delton Prairie, MD Authorized by: Delton Prairie, MD     Interpretation: normal     ECG rate:  96   ECG rate assessment: normal     Rhythm: sinus rhythm     Ectopy: none     Conduction: normal     Medications  lactated ringers bolus 1,000 mL (0 mLs Intravenous Stopped 12/29/21 0212)  ketorolac (TORADOL) 30 MG/ML injection 15 mg (15 mg Intravenous Given 12/29/21 0140)     IMPRESSION / MDM /  ASSESSMENT AND PLAN / ED COURSE  I reviewed the triage vital signs and the nursing notes.  Differential diagnosis includes, but is not limited to, intentional overdose, polysubstance abuse, cardiogenic syncope, opiate overdose  {Patient presents with symptoms of an acute illness or injury that is potentially life-threatening.  41 year old female presents to the ED after reported accidental overdose of opiates in the setting of ethanol ingestion.  She looks systemically well.  She was noted to be tachycardic but stable.  Has a reassuring neurologic examination.  EKG without interval changes to suggest cardiogenic syncope.  We will keep her on the monitor, check basic labs, ethanol level and UDS.  We will empirically provide IV fluids and observe for at least a few hours to ensure no need for repeat dosing of Narcan.   She is observed for greater than 4 hours without any Narcan.  Tachycardia resolved with IV fluids.  UDS with cocaine and THC.  Microscopic hematuria is noted but she has no urinary symptoms to suggest acute cystitis.  Normal CBC and metabolic panel.  No barriers to outpatient management.  Indications for emergent psychiatric evaluation or IVC.  We discussed abstinence from these recreational drugs and return precautions for the ED.  Clinical Course as of 12/29/21 0339  Sat Dec 29, 2021  0329 Reassessed.  Family at bedside.  Patient reports she feels well and reports he is ready to go home.  Again confirms she has not tried to harm herself. [DS]    Clinical Course User Index [DS] Delton Prairie, MD     FINAL CLINICAL IMPRESSION(S) / ED DIAGNOSES   Final diagnoses:  Opiate overdose, accidental or unintentional, initial encounter (HCC)  Polysubstance abuse (HCC)     Rx / DC Orders   ED Discharge Orders     None        Note:  This document was prepared using Dragon voice recognition software and may include unintentional dictation errors.   Delton Prairie, MD 12/29/21  [phone]

## 2021-12-29 DIAGNOSIS — T40601A Poisoning by unspecified narcotics, accidental (unintentional), initial encounter: Secondary | ICD-10-CM | POA: Diagnosis not present

## 2021-12-29 LAB — URINE DRUG SCREEN, QUALITATIVE (ARMC ONLY)
Amphetamines, Ur Screen: NOT DETECTED
Barbiturates, Ur Screen: NOT DETECTED
Benzodiazepine, Ur Scrn: NOT DETECTED
Cannabinoid 50 Ng, Ur ~~LOC~~: POSITIVE — AB
Cocaine Metabolite,Ur ~~LOC~~: POSITIVE — AB
MDMA (Ecstasy)Ur Screen: NOT DETECTED
Methadone Scn, Ur: NOT DETECTED
Opiate, Ur Screen: NOT DETECTED
Phencyclidine (PCP) Ur S: NOT DETECTED
Tricyclic, Ur Screen: NOT DETECTED

## 2021-12-29 LAB — COMPREHENSIVE METABOLIC PANEL
ALT: 22 U/L (ref 0–44)
AST: 33 U/L (ref 15–41)
Albumin: 3.5 g/dL (ref 3.5–5.0)
Alkaline Phosphatase: 50 U/L (ref 38–126)
Anion gap: 10 (ref 5–15)
BUN: 10 mg/dL (ref 6–20)
CO2: 21 mmol/L — ABNORMAL LOW (ref 22–32)
Calcium: 8.4 mg/dL — ABNORMAL LOW (ref 8.9–10.3)
Chloride: 108 mmol/L (ref 98–111)
Creatinine, Ser: 0.7 mg/dL (ref 0.44–1.00)
GFR, Estimated: >60 mL/min (ref >60)
Glucose, Bld: 97 mg/dL (ref 70–99)
Potassium: 3.7 mmol/L (ref 3.5–5.1)
Sodium: 139 mmol/L (ref 135–145)
Total Bilirubin: 0.3 mg/dL (ref 0.3–1.2)
Total Protein: 6.8 g/dL (ref 6.5–8.1)

## 2021-12-29 LAB — CBC WITH DIFFERENTIAL/PLATELET
Abs Immature Granulocytes: 0.05 10*3/uL (ref 0.00–0.07)
Basophils Absolute: 0.1 10*3/uL (ref 0.0–0.1)
Basophils Relative: 1 %
Eosinophils Absolute: 0 10*3/uL (ref 0.0–0.5)
Eosinophils Relative: 0 %
HCT: 42.3 % (ref 36.0–46.0)
Hemoglobin: 13.8 g/dL (ref 12.0–15.0)
Immature Granulocytes: 1 %
Lymphocytes Relative: 15 %
Lymphs Abs: 1.5 10*3/uL (ref 0.7–4.0)
MCH: 31.8 pg (ref 26.0–34.0)
MCHC: 32.6 g/dL (ref 30.0–36.0)
MCV: 97.5 fL (ref 80.0–100.0)
Monocytes Absolute: 0.5 10*3/uL (ref 0.1–1.0)
Monocytes Relative: 5 %
Neutro Abs: 7.8 10*3/uL — ABNORMAL HIGH (ref 1.7–7.7)
Neutrophils Relative %: 78 %
Platelets: 225 10*3/uL (ref 150–400)
RBC: 4.34 MIL/uL (ref 3.87–5.11)
RDW: 12.9 % (ref 11.5–15.5)
WBC: 9.9 10*3/uL (ref 4.0–10.5)
nRBC: 0 % (ref 0.0–0.2)

## 2021-12-29 LAB — URINALYSIS, ROUTINE W REFLEX MICROSCOPIC
Bacteria, UA: NONE SEEN
Bilirubin Urine: NEGATIVE
Glucose, UA: NEGATIVE mg/dL
Ketones, ur: 5 mg/dL — AB
Leukocytes,Ua: NEGATIVE
Nitrite: NEGATIVE
Protein, ur: 30 mg/dL — AB
RBC / HPF: >50 RBC/hpf — ABNORMAL HIGH (ref 0–5)
Specific Gravity, Urine: 1.019 (ref 1.005–1.030)
WBC, UA: NONE SEEN WBC/hpf (ref 0–5)
pH: 5 (ref 5.0–8.0)

## 2021-12-29 LAB — MAGNESIUM: Magnesium: 1.7 mg/dL (ref 1.7–2.4)

## 2021-12-29 LAB — ETHANOL: Alcohol, Ethyl (B): 225 mg/dL — ABNORMAL HIGH (ref <10)

## 2021-12-29 LAB — PREGNANCY, URINE: Preg Test, Ur: NEGATIVE

## 2021-12-29 MED ORDER — KETOROLAC TROMETHAMINE 30 MG/ML IJ SOLN
15.0000 mg | Freq: Once | INTRAMUSCULAR | Status: AC
Start: 2021-12-29 — End: 2021-12-29
  Administered 2021-12-29: 15 mg via INTRAVENOUS
  Filled 2021-12-29: qty 1

## 2022-06-04 ENCOUNTER — Telehealth: Payer: Self-pay | Admitting: Family Medicine

## 2022-06-04 ENCOUNTER — Other Ambulatory Visit: Payer: Self-pay | Admitting: Family Medicine

## 2022-06-04 DIAGNOSIS — E039 Hypothyroidism, unspecified: Secondary | ICD-10-CM

## 2022-06-04 NOTE — Telephone Encounter (Signed)
Patient called and stated if the medications doxycycline doxycycline (VIBRA-TABS) 100 MG tablet and Euthyrox levothyroxine (EUTHYROX) 50 MCG tablet would be filled.

## 2022-06-04 NOTE — Telephone Encounter (Signed)
Doxycycline last filled on 10/12/21 #30 tabs with 1 refill   Levothyroxine last filled on 03/09/21 #90 tabs with 1 refill   med f/u scheduled on 06/07/22

## 2022-06-04 NOTE — Telephone Encounter (Signed)
See refill request.

## 2022-06-07 ENCOUNTER — Ambulatory Visit (INDEPENDENT_AMBULATORY_CARE_PROVIDER_SITE_OTHER): Payer: 59 | Admitting: Family Medicine

## 2022-06-07 ENCOUNTER — Encounter: Payer: Self-pay | Admitting: Family Medicine

## 2022-06-07 VITALS — BP 112/66 | HR 101 | Temp 97.5°F | Ht 71.0 in | Wt 142.1 lb

## 2022-06-07 DIAGNOSIS — E538 Deficiency of other specified B group vitamins: Secondary | ICD-10-CM

## 2022-06-07 DIAGNOSIS — F1010 Alcohol abuse, uncomplicated: Secondary | ICD-10-CM

## 2022-06-07 DIAGNOSIS — E039 Hypothyroidism, unspecified: Secondary | ICD-10-CM | POA: Diagnosis not present

## 2022-06-07 DIAGNOSIS — L719 Rosacea, unspecified: Secondary | ICD-10-CM

## 2022-06-07 DIAGNOSIS — K219 Gastro-esophageal reflux disease without esophagitis: Secondary | ICD-10-CM | POA: Diagnosis not present

## 2022-06-07 LAB — CBC WITH DIFFERENTIAL/PLATELET
Basophils Absolute: 0 10*3/uL (ref 0.0–0.1)
Basophils Relative: 0.5 % (ref 0.0–3.0)
Eosinophils Absolute: 0.1 10*3/uL (ref 0.0–0.7)
Eosinophils Relative: 1.1 % (ref 0.0–5.0)
HCT: 40.9 % (ref 36.0–46.0)
Hemoglobin: 13.6 g/dL (ref 12.0–15.0)
Lymphocytes Relative: 24 % (ref 12.0–46.0)
Lymphs Abs: 1.8 10*3/uL (ref 0.7–4.0)
MCHC: 33.2 g/dL (ref 30.0–36.0)
MCV: 97.3 fl (ref 78.0–100.0)
Monocytes Absolute: 0.5 10*3/uL (ref 0.1–1.0)
Monocytes Relative: 6.1 % (ref 3.0–12.0)
Neutro Abs: 5.1 10*3/uL (ref 1.4–7.7)
Neutrophils Relative %: 68.3 % (ref 43.0–77.0)
Platelets: 225 10*3/uL (ref 150.0–400.0)
RBC: 4.2 Mil/uL (ref 3.87–5.11)
RDW: 15.6 % — ABNORMAL HIGH (ref 11.5–15.5)
WBC: 7.5 10*3/uL (ref 4.0–10.5)

## 2022-06-07 LAB — VITAMIN B12: Vitamin B-12: 296 pg/mL (ref 211–911)

## 2022-06-07 LAB — TSH: TSH: 4.98 u[IU]/mL (ref 0.35–5.50)

## 2022-06-07 MED ORDER — CLINDAMYCIN PHOSPHATE 1 % EX GEL: Freq: Every day | CUTANEOUS | 1 refills | Status: DC

## 2022-06-07 NOTE — Assessment & Plan Note (Addendum)
Pt did not tolerate doxycycline (nausea)   Disc topical tx options  Px topical clindamycin to use daily  Enc her to limit alcohol and avoid excess sun exposure  Smoking cessation may help as well

## 2022-06-07 NOTE — Assessment & Plan Note (Signed)
Pt uses otc prilosec -this helps globus sensation  Enc her to be better about mvi daily  B12 level ordered today

## 2022-06-07 NOTE — Assessment & Plan Note (Signed)
No clinical changes TSH ordered  Taking levothyroxine 50 mcg daily

## 2022-06-07 NOTE — Assessment & Plan Note (Signed)
Pt has cut alcohol to 2 glasses of wine per day  States she is not dependent or struggling with it  Enc her to cut to 1 drink per day (or none which is preferable) for liver and overall health

## 2022-06-07 NOTE — Assessment & Plan Note (Signed)
Pt ha snot been taking her mvi lately  Mod alcohol intake   Lab ordered

## 2022-06-07 NOTE — Progress Notes (Signed)
Subjective:    Patient ID: Nicole [name], female    DOB: 27-Jan-1981, 41 y.o.   MRN: [id number]  HPI Pt presents for f/u of hypothyroidism and rosacea and other chronic health problems   Wt Readings from Last 3 Encounters:  06/07/22 142 lb 2 oz (64.5 kg)  12/28/21 135 lb (61.2 kg)  10/12/21 134 lb 6 oz (61 kg)   19.82 kg/m  Feeling ok  Not doing a lot  Working  Moving her daughter into an apartment   Taking care of herself  Not as much exercise as she should    Lab Results  Component Value Date   TSH 2.04 08/25/2020   Takes levothyroxine 50 mcg daily  Feels the same No change in skin or hair or energy level   Occ misses a day of med/not often     Due for labs   Rosacea with acne  Take doxycycline 100 mg daily  It works while she takes it  Then made her nauseated - even with food it made her sick  Using differin (some peeling with dry air)   Avoids sun  Does not tan  Has mixed complexion     H/o low B12 Lab Results  Component Value Date   VITAMINB12 747 08/25/2020   Has not been taking her vitamin  Used to take mvi  Takes folic acid daily   Has taken ppi in past for globus sensation  Takes omeprazole every day  Uses an antacid from [facility] - really helps    Alcohol intake : she drinks 2 glasses of wine per night    Lab Results  Component Value Date   WBC 9.9 12/29/2021   HGB 13.8 12/29/2021   HCT 42.3 12/29/2021   MCV 97.5 12/29/2021   PLT 225 12/29/2021   Patient Active Problem List   Diagnosis Date Noted   GERD (gastroesophageal reflux disease) 06/07/2022   Acne rosacea 10/12/2021   Nail abnormality 10/12/2021   Insomnia 08/25/2020   Alcohol use 08/25/2020   Globus sensation 08/25/2020   Noninfectious diarrhea    Vitamin B12 deficiency 10/25/2015   Hypothyroidism 10/25/2015   Alcohol abuse 10/25/2015   Nonspecific abnormal electrocardiogram (ECG) (EKG) 10/12/2015   Symptomatic anemia 10/12/2015   Tobacco abuse 06/01/2014    History of nephrolithiasis 06/01/2014   Past Medical History:  Diagnosis Date   Anemia    Dysrhythmia    [place] in HS.  Irregular with anemia.  Better now   Hypothyroidism    Kidney stones    Migraines    UTI (urinary tract infection)    Past Surgical History:  Procedure Laterality Date   COLONOSCOPY WITH PROPOFOL N/A 11/17/2015   Procedure: COLONOSCOPY WITH PROPOFOL;  Surgeon: Midge Minium, MD;  Location: [facility];  Service: Endoscopy;  Laterality: N/A;   WISDOM TOOTH EXTRACTION     Social History   Tobacco Use   Smoking status: Every Day    Packs/day: 1.00    Years: 20.00    Total pack years: 20.00    Types: Cigarettes   Smokeless tobacco: Never  Substance Use Topics   Alcohol use: Not Currently    Comment: HIstory 3 drinks per day, stopped 9/19; recently quit drinking as of 08/2018   Drug use: No   Family History  Problem Relation Age of Onset   Hypertension Mother    Bladder Cancer Maternal Grandmother    No Known Allergies Current Outpatient Medications on File Prior to Visit  Medication Sig Dispense Refill   Cyanocobalamin (VITAMIN B-12 PO) Take by mouth daily.     doxylamine, Sleep, (UNISOM) 25 MG tablet Take 25 mg by mouth at bedtime as needed.     FOLIC ACID PO Take by mouth daily.     levothyroxine (SYNTHROID) 50 MCG tablet TAKE 1 TABLET BY MOUTH ONCE DAILY BEFORE BREAKFAST 30 tablet 0   Multiple Vitamin (MULTIVITAMIN WITH MINERALS) TABS tablet Take 1 tablet by mouth daily. 30 tablet 0   omeprazole (PRILOSEC) 20 MG capsule TAKE 1 CAPSULE BY MOUTH TWICE DAILY BEFORE A MEAL 60 capsule 2   No current facility-administered medications on file prior to visit.     Review of Systems  Constitutional:  Negative for activity change, appetite change, fatigue, fever and unexpected weight change.       Occ fatigue  No more than usual  HENT:  Negative for congestion, ear pain, rhinorrhea, sinus pressure and sore throat.   Eyes:  Negative for pain, redness  and visual disturbance.  Respiratory:  Negative for cough, shortness of breath and wheezing.   Cardiovascular:  Negative for chest pain and palpitations.  Gastrointestinal:  Negative for abdominal pain, blood in stool, constipation and diarrhea.  Endocrine: Negative for polydipsia and polyuria.  Genitourinary:  Negative for dysuria, frequency and urgency.  Musculoskeletal:  Negative for arthralgias, back pain and myalgias.  Skin:  Negative for pallor and rash.       Rosacea with acne on face  Break outs come and go  Allergic/Immunologic: Negative for environmental allergies.  Neurological:  Negative for dizziness, syncope and headaches.  Hematological:  Negative for adenopathy. Does not bruise/bleed easily.  Psychiatric/Behavioral:  Negative for decreased concentration and dysphoric mood. The patient is not nervous/anxious.        Objective:   Physical Exam Constitutional:      General: She is not in acute distress.    Appearance: Normal appearance. She is well-developed and normal weight. She is not ill-appearing or diaphoretic.  HENT:     Head: Normocephalic and atraumatic.  Eyes:     General: No scleral icterus.    Conjunctiva/sclera: Conjunctivae normal.     Pupils: Pupils are equal, round, and reactive to light.  Neck:     Thyroid: No thyromegaly.     Vascular: No carotid bruit or JVD.  Cardiovascular:     Rate and Rhythm: Normal rate and regular rhythm.     Heart sounds: Normal heart sounds.     No gallop.  Pulmonary:     Effort: Pulmonary effort is normal. No respiratory distress.     Breath sounds: Normal breath sounds. No wheezing or rales.  Abdominal:     General: There is no distension or abdominal bruit.     Palpations: Abdomen is soft.  Musculoskeletal:     Cervical back: Normal range of motion and neck supple.     Right lower leg: No edema.     Left lower leg: No edema.  Lymphadenopathy:     Cervical: No cervical adenopathy.  Skin:    General: Skin is  warm and dry.     Coloration: Skin is not pale.     Findings: No rash.     Comments: Some inflammatory comedonal lesions on bilat cheeks  Minimal redness   Neurological:     Mental Status: She is alert.     Coordination: Coordination normal.     Deep Tendon Reflexes: Reflexes are normal and symmetric. Reflexes normal.  Psychiatric:  Mood and Affect: Mood normal.           Assessment & Plan:   Problem List Items Addressed This Visit       Digestive   GERD (gastroesophageal reflux disease)    Pt uses otc prilosec -this helps globus sensation  Enc her to be better about mvi daily  B12 level ordered today         Endocrine   Hypothyroidism - Primary    No clinical changes TSH ordered  Taking levothyroxine 50 mcg daily         Relevant Orders   TSH     Musculoskeletal and Integument   Acne rosacea    Pt did not tolerate doxycycline (nausea)   Disc topical tx options  Px topical clindamycin to use daily  Enc her to limit alcohol and avoid excess sun exposure  Smoking cessation may help as well        Other   Alcohol abuse    Pt has cut alcohol to 2 glasses of wine per day  States she is not dependent or struggling with it  Enc her to cut to 1 drink per day (or none which is preferable) for liver and overall health       Vitamin B12 deficiency    Pt ha snot been taking her mvi lately  Mod alcohol intake   Lab ordered       Relevant Orders   CBC with Differential/Platelet   Vitamin B12

## 2022-06-07 NOTE — Patient Instructions (Signed)
Get back on track with a multi vitamin daily   Try the clindamycin gel for the rosacea acne daily  If not improving we can refer you to dermatology  Do use sun protection   Labs today   Consider cutting down to 1 drink per day or less  Quitting is even better

## 2022-06-30 ENCOUNTER — Other Ambulatory Visit: Payer: Self-pay | Admitting: Family Medicine

## 2022-06-30 DIAGNOSIS — E039 Hypothyroidism, unspecified: Secondary | ICD-10-CM

## 2023-03-19 ENCOUNTER — Ambulatory Visit: Payer: Self-pay | Admitting: Family Medicine

## 2023-06-15 ENCOUNTER — Telehealth: Payer: Managed Care, Other (non HMO) | Admitting: Family Medicine

## 2023-06-15 DIAGNOSIS — N3000 Acute cystitis without hematuria: Secondary | ICD-10-CM | POA: Diagnosis not present

## 2023-06-15 DIAGNOSIS — B351 Tinea unguium: Secondary | ICD-10-CM | POA: Diagnosis not present

## 2023-06-15 MED ORDER — NITROFURANTOIN MONOHYD MACRO 100 MG PO CAPS: 100.0000 mg | ORAL_CAPSULE | Freq: Two times a day (BID) | ORAL | 0 refills | Status: AC

## 2023-06-15 MED ORDER — CICLOPIROX 8 % EX SOLN: Freq: Every day | CUTANEOUS | 0 refills | Status: DC

## 2023-06-15 NOTE — Progress Notes (Signed)
Virtual Visit Consent   Nicole [name], you are scheduled for a virtual visit with a [facility] provider today. Just as with appointments in the office, your consent must be obtained to participate. Your consent will be active for this visit and any virtual visit you may have with one of our providers in the next 365 days. If you have a MyChart account, a copy of this consent can be sent to you electronically.  As this is a virtual visit, video technology does not allow for your provider to perform a traditional examination. This may limit your provider's ability to fully assess your condition. If your provider identifies any concerns that need to be evaluated in person or the need to arrange testing (such as labs, EKG, etc.), we will make arrangements to do so. Although advances in technology are sophisticated, we cannot ensure that it will always work on either your end or our end. If the connection with a video visit is poor, the visit may have to be switched to a telephone visit. With either a video or telephone visit, we are not always able to ensure that we have a secure connection.  By engaging in this virtual visit, you consent to the provision of healthcare and authorize for your insurance to be billed (if applicable) for the services provided during this visit. Depending on your insurance coverage, you may receive a charge related to this service.  I need to obtain your verbal consent now. Are you willing to proceed with your visit today? Nicole [name] has provided verbal consent on 06/15/2023 for a virtual visit (video or telephone). Georgana Curio, FNP  Date: 06/15/2023 7:23 PM  Virtual Visit via Video Note   I, Georgana Curio, connected with  Nicole [name]  ([id number], 1981/04/28) on 06/15/23 at  7:15 PM EST by a video-enabled telemedicine application and verified that I am speaking with the correct person using two identifiers.  Location: Patient: Virtual Visit Location Patient:  Home Provider: Virtual Visit Location Provider: Home Office   I discussed the limitations of evaluation and management by telemedicine and the availability of in person appointments. The patient expressed understanding and agreed to proceed.    History of Present Illness: Nicole [name] is a 42 y.o. who identifies as a female who was assigned female at birth, and is being seen today for burning and frequency on urination. No fever or abd pain. Aso has toenail with fungus great toe.Marland Kitchen  HPI: HPI  Problems:  Patient Active Problem List   Diagnosis Date Noted   GERD (gastroesophageal reflux disease) 06/07/2022   Acne rosacea 10/12/2021   Nail abnormality 10/12/2021   Insomnia 08/25/2020   Alcohol use 08/25/2020   Globus sensation 08/25/2020   Noninfectious diarrhea    Vitamin B12 deficiency 10/25/2015   Hypothyroidism 10/25/2015   Alcohol abuse 10/25/2015   Nonspecific abnormal electrocardiogram (ECG) (EKG) 10/12/2015   Symptomatic anemia 10/12/2015   Tobacco abuse 06/01/2014   History of nephrolithiasis 06/01/2014    Allergies: No Known Allergies Medications:  Current Outpatient Medications:    clindamycin (CLINDAGEL) 1 % gel, Apply topically daily. Apply think film to affected areas of breakouts daily, Disp: 30 g, Rfl: 1   Cyanocobalamin (VITAMIN B-12 PO), Take by mouth daily., Disp: , Rfl:    doxylamine, Sleep, (UNISOM) 25 MG tablet, Take 25 mg by mouth at bedtime as needed., Disp: , Rfl:    FOLIC ACID PO, Take by mouth daily., Disp: , Rfl:  levothyroxine (SYNTHROID) 50 MCG tablet, TAKE 1 TABLET BY MOUTH ONCE DAILY BEFORE BREAKFAST, Disp: 90 tablet, Rfl: 1   Multiple Vitamin (MULTIVITAMIN WITH MINERALS) TABS tablet, Take 1 tablet by mouth daily., Disp: 30 tablet, Rfl: 0   omeprazole (PRILOSEC) 20 MG capsule, TAKE 1 CAPSULE BY MOUTH TWICE DAILY BEFORE A MEAL, Disp: 60 capsule, Rfl: 2  Observations/Objective: Patient is well-developed, well-nourished in no acute distress.   Resting comfortably  at home.  Head is normocephalic, atraumatic.  No labored breathing.  Speech is clear and coherent with logical content.  Patient is alert and oriented at baseline.    Assessment and Plan: 1. Acute cystitis without hematuria (Primary)  2. Onychomycosis  Follow up with pcp regarding toenail fungus., increase fluids, UTI preventative measures discussed. UC if sx worsen.   Follow Up Instructions: I discussed the assessment and treatment plan with the patient. The patient was provided an opportunity to ask questions and all were answered. The patient agreed with the plan and demonstrated an understanding of the instructions.  A copy of instructions were sent to the patient via MyChart unless otherwise noted below.     The patient was advised to call back or seek an in-person evaluation if the symptoms worsen or if the condition fails to improve as anticipated.    Georgana Curio, FNP

## 2023-06-15 NOTE — Patient Instructions (Signed)
Urinary Tract Infection, Adult  A urinary tract infection (UTI) is an infection of any part of the urinary tract. The urinary tract includes the kidneys, ureters, bladder, and urethra. These organs make, store, and get rid of urine in the body. An upper UTI affects the ureters and kidneys. A lower UTI affects the bladder and urethra. What are the causes? Most urinary tract infections are caused by bacteria in your genital area around your urethra, where urine leaves your body. These bacteria grow and cause inflammation of your urinary tract. What increases the risk? You are more likely to develop this condition if: You have a urinary catheter that stays in place. You are not able to control when you urinate or have a bowel movement (incontinence). You are female and you: Use a spermicide or diaphragm for birth control. Have low estrogen levels. Are pregnant. You have certain genes that increase your risk. You are sexually active. You take antibiotic medicines. You have a condition that causes your flow of urine to slow down, such as: An enlarged prostate, if you are female. Blockage in your urethra. A kidney stone. A nerve condition that affects your bladder control (neurogenic bladder). Not getting enough to drink, or not urinating often. You have certain medical conditions, such as: Diabetes. A weak disease-fighting system (immunesystem). Sickle cell disease. Gout. Spinal cord injury. What are the signs or symptoms? Symptoms of this condition include: Needing to urinate right away (urgency). Frequent urination. This may include small amounts of urine each time you urinate. Pain or burning with urination. Blood in the urine. Urine that smells bad or unusual. Trouble urinating. Cloudy urine. Vaginal discharge, if you are female. Pain in the abdomen or the lower back. You may also have: Vomiting or a decreased appetite. Confusion. Irritability or tiredness. A fever or  chills. Diarrhea. The first symptom in older adults may be confusion. In some cases, they may not have any symptoms until the infection has worsened. How is this diagnosed? This condition is diagnosed based on your medical history and a physical exam. You may also have other tests, including: Urine tests. Blood tests. Tests for STIs (sexually transmitted infections). If you have had more than one UTI, a cystoscopy or imaging studies may be done to determine the cause of the infections. How is this treated? Treatment for this condition includes: Antibiotic medicine. Over-the-counter medicines to treat discomfort. Drinking enough water to stay hydrated. If you have frequent infections or have other conditions such as a kidney stone, you may need to see a health care provider who specializes in the urinary tract (urologist). In rare cases, urinary tract infections can cause sepsis. Sepsis is a life-threatening condition that occurs when the body responds to an infection. Sepsis is treated in the hospital with IV antibiotics, fluids, and other medicines. Follow these instructions at home:  Medicines Take over-the-counter and prescription medicines only as told by your health care provider. If you were prescribed an antibiotic medicine, take it as told by your health care provider. Do not stop using the antibiotic even if you start to feel better. General instructions Make sure you: Empty your bladder often and completely. Do not hold urine for long periods of time. Empty your bladder after sex. Wipe from front to back after urinating or having a bowel movement if you are female. Use each tissue only one time when you wipe. Drink enough fluid to keep your urine pale yellow. Keep all follow-up visits. This is important. Contact a health   care provider if: Your symptoms do not get better after 1-2 days. Your symptoms go away and then return. Get help right away if: You have severe pain in  your back or your lower abdomen. You have a fever or chills. You have nausea or vomiting. Summary A urinary tract infection (UTI) is an infection of any part of the urinary tract, which includes the kidneys, ureters, bladder, and urethra. Most urinary tract infections are caused by bacteria in your genital area. Treatment for this condition often includes antibiotic medicines. If you were prescribed an antibiotic medicine, take it as told by your health care provider. Do not stop using the antibiotic even if you start to feel better. Keep all follow-up visits. This is important. This information is not intended to replace advice given to you by your health care provider. Make sure you discuss any questions you have with your health care provider. Document Revised: 01/16/2020 Document Reviewed: 01/21/2020 Elsevier Patient Education  2024 Elsevier Inc.  

## 2023-08-12 ENCOUNTER — Ambulatory Visit: Payer: Managed Care, Other (non HMO) | Admitting: Family Medicine

## 2023-08-12 ENCOUNTER — Encounter: Payer: Self-pay | Admitting: Family Medicine

## 2023-08-12 VITALS — BP 126/78 | HR 93 | Temp 98.7°F | Ht 71.0 in | Wt 140.5 lb

## 2023-08-12 DIAGNOSIS — R350 Frequency of micturition: Secondary | ICD-10-CM | POA: Diagnosis not present

## 2023-08-12 DIAGNOSIS — N3000 Acute cystitis without hematuria: Secondary | ICD-10-CM

## 2023-08-12 LAB — POCT UA - MICROSCOPIC ONLY

## 2023-08-12 LAB — POC URINALSYSI DIPSTICK (AUTOMATED)
Spec Grav, UA: >=1.03 — AB (ref 1.010–1.025)
pH, UA: 5 (ref 5.0–8.0)

## 2023-08-12 MED ORDER — CEPHALEXIN 500 MG PO CAPS: 500.0000 mg | ORAL_CAPSULE | Freq: Two times a day (BID) | ORAL | 0 refills | Status: DC

## 2023-08-12 NOTE — Patient Instructions (Addendum)
 Drink lots of water  Take the antibiotic as directed, generic keflex   If symptoms suddenly worsen let us know (go to ER if severe)    We will reach out with urine culture when it returns    I hope you feel better soon

## 2023-08-12 NOTE — Progress Notes (Signed)
 Subjective:    Patient ID: Nicole [name], female    DOB: Jan 26, 1981, 43 y.o.   MRN: [id number]  HPI  Wt Readings from Last 3 Encounters:  08/12/23 140 lb 8 oz (63.7 kg)  06/07/22 142 lb 2 oz (64.5 kg)  12/28/21 135 lb (61.2 kg)   19.60 kg/m  Vitals:   08/12/23 1556  BP: 126/78  Pulse: 93  Temp: 98.7 F (37.1 C)  SpO2: 99%    Pt presents with urinary symptoms   Urinary frequency and urgency Does not feel like she empties bladder  Tingling with urination /discomfort   Took some azo  Lots of water    No fever No n/v  No flank pain   ? If blood-is spotting from period today    Did have uti symptoms in December  Did e visit and was treated with macrobid - ? If it worked  No labs or culture from that   Then had a visit with good RX (unsure what - not macrobid or amox)  That improved but did come back quickly   Results for orders placed or performed in visit on 08/12/23  POCT Urinalysis Dipstick (Automated)   Collection Time: 08/12/23  4:10 PM  Result Value Ref Range   Color, UA Orange    Clarity, UA Clear    Glucose, UA     Bilirubin, UA     Ketones, UA     Spec Grav, UA >=1.030 (A) 1.010 - 1.025   Blood, UA     pH, UA 5.0 5.0 - 8.0   Protein, UA     Urobilinogen, UA     Nitrite, UA     Leukocytes, UA    POCT UA - Microscopic Only   Collection Time: 08/12/23  4:18 PM  Result Value Ref Range   WBC, Ur, HPF, POC 5-8 0 - 5   RBC, Urine, Miroscopic 3-5 0 - 2   Bacteria, U Microscopic many None - Trace   Mucus, UA few    Epithelial cells, urine per micros few    Crystals, Ur, HPF, POC few    Casts, Ur, LPF, POC none    Yeast, UA none       Patient Active Problem List   Diagnosis Date Noted   GERD (gastroesophageal reflux disease) 06/07/2022   Acne rosacea 10/12/2021   Nail abnormality 10/12/2021   Insomnia 08/25/2020   Alcohol use 08/25/2020   Globus sensation 08/25/2020   Noninfectious diarrhea    Vitamin B12 deficiency 10/25/2015    Hypothyroidism 10/25/2015   Alcohol abuse 10/25/2015   Nonspecific abnormal electrocardiogram (ECG) (EKG) 10/12/2015   Symptomatic anemia 10/12/2015   Tobacco abuse 06/01/2014   History of nephrolithiasis 06/01/2014   UTI (urinary tract infection) 06/01/2014   Past Medical History:  Diagnosis Date   Anemia    Dysrhythmia    Tacchy in HS.  Irregular with anemia.  Better now   Hypothyroidism    Kidney stones    Migraines    UTI (urinary tract infection)    Past Surgical History:  Procedure Laterality Date   COLONOSCOPY WITH PROPOFOL N/A 11/17/2015   Procedure: COLONOSCOPY WITH PROPOFOL;  Surgeon: Midge Minium, MD;  Location: [facility];  Service: Endoscopy;  Laterality: N/A;   WISDOM TOOTH EXTRACTION     Social History   Tobacco Use   Smoking status: Every Day    Current packs/day: 1.00    Average packs/day: 1 pack/day for 20.0 years (20.0  ttl pk-yrs)    Types: Cigarettes   Smokeless tobacco: Never  Substance Use Topics   Alcohol use: Not Currently    Comment: HIstory 3 drinks per day, stopped 9/19; recently quit drinking as of 08/2018   Drug use: No   Family History  Problem Relation Age of Onset   Hypertension Mother    Bladder Cancer Maternal Grandmother    No Known Allergies Current Outpatient Medications on File Prior to Visit  Medication Sig Dispense Refill   clindamycin (CLINDAGEL) 1 % gel Apply topically daily. Apply think film to affected areas of breakouts daily 30 g 1   Cyanocobalamin (VITAMIN B-12 PO) Take by mouth daily.     doxylamine, Sleep, (UNISOM) 25 MG tablet Take 25 mg by mouth at bedtime as needed.     FOLIC ACID PO Take by mouth daily.     levothyroxine (SYNTHROID) 50 MCG tablet TAKE 1 TABLET BY MOUTH ONCE DAILY BEFORE BREAKFAST 90 tablet 1   Multiple Vitamin (MULTIVITAMIN WITH MINERALS) TABS tablet Take 1 tablet by mouth daily. 30 tablet 0   omeprazole (PRILOSEC) 20 MG capsule TAKE 1 CAPSULE BY MOUTH TWICE DAILY BEFORE A MEAL 60 capsule 2    No current facility-administered medications on file prior to visit.    Review of Systems  Constitutional:  Negative for activity change, appetite change, fatigue and fever.  HENT:  Negative for congestion and sore throat.   Eyes:  Negative for itching and visual disturbance.  Respiratory:  Negative for cough and shortness of breath.   Cardiovascular:  Negative for leg swelling.  Gastrointestinal:  Negative for abdominal distention, abdominal pain, constipation, diarrhea and nausea.  Endocrine: Negative for cold intolerance and polydipsia.  Genitourinary:  Positive for dysuria, frequency and urgency. Negative for difficulty urinating, flank pain and hematuria.  Musculoskeletal:  Negative for myalgias.  Skin:  Negative for rash.  Allergic/Immunologic: Negative for immunocompromised state.  Neurological:  Negative for dizziness and weakness.  Hematological:  Negative for adenopathy.       Objective:   Physical Exam Constitutional:      General: She is not in acute distress.    Appearance: Normal appearance. She is well-developed and normal weight. She is not ill-appearing or diaphoretic.  HENT:     Head: Normocephalic and atraumatic.  Eyes:     Conjunctiva/sclera: Conjunctivae normal.     Pupils: Pupils are equal, round, and reactive to light.  Cardiovascular:     Rate and Rhythm: Normal rate and regular rhythm.     Heart sounds: Normal heart sounds.  Pulmonary:     Effort: Pulmonary effort is normal.     Breath sounds: Normal breath sounds.  Abdominal:     General: Bowel sounds are normal. There is no distension.     Palpations: Abdomen is soft.     Tenderness: There is abdominal tenderness. There is no rebound.     Comments: No cva tenderness  Mild suprapubic tenderness without fullness   Musculoskeletal:     Cervical back: Normal range of motion and neck supple.  Lymphadenopathy:     Cervical: No cervical adenopathy.  Skin:    Findings: No rash.  Neurological:      Mental Status: She is alert.           Assessment & Plan:   Problem List Items Addressed This Visit       Genitourinary   UTI (urinary tract infection)   Uncomplicated Voiding symptoms  May have had several utis  in the past few months  (with e visits so no urinalysis or culture)  Reviewed e visit and plan from 06/15/23 (treated with macrobid)  Remote history of post coital uti as well   Positive urinalysis (micro) today-on azo  Will treatment with keflex bid for 7 d  Pend culture  Discussed ways to prevent uti incl voiding pre/post intercourse  Will keep up water intake   If this becomes recurrent may need to consider coital prophylaxis in future  Handout given        Relevant Medications   cephALEXin (KEFLEX) 500 MG capsule   Other Relevant Orders   POCT UA - Microscopic Only (Completed)   Urine Culture   Other Visit Diagnoses       Urinary frequency    -  Primary   Relevant Orders   POCT Urinalysis Dipstick (Automated) (Completed)   POCT UA - Microscopic Only (Completed)   Urine Culture

## 2023-08-12 NOTE — Assessment & Plan Note (Addendum)
 Uncomplicated Voiding symptoms  May have had several utis in the past few months  (with e visits so no urinalysis or culture)  Reviewed e visit and plan from 06/15/23 (treated with macrobid)  Remote history of post coital uti as well   Positive urinalysis (micro) today-on azo  Will treatment with keflex bid for 7 d  Pend culture  Discussed ways to prevent uti incl voiding pre/post intercourse  Will keep up water intake   If this becomes recurrent may need to consider coital prophylaxis in future  Handout given

## 2023-08-14 ENCOUNTER — Encounter: Payer: Self-pay | Admitting: Family Medicine

## 2023-08-14 LAB — URINE CULTURE
MICRO NUMBER:: 16098483
SPECIMEN QUALITY:: ADEQUATE

## 2023-08-31 MED ORDER — CEPHALEXIN 500 MG PO CAPS: 500.0000 mg | ORAL_CAPSULE | Freq: Two times a day (BID) | ORAL | 0 refills | Status: DC

## 2023-11-20 ENCOUNTER — Ambulatory Visit (INDEPENDENT_AMBULATORY_CARE_PROVIDER_SITE_OTHER): Admitting: Internal Medicine

## 2023-11-20 ENCOUNTER — Encounter: Payer: Self-pay | Admitting: Internal Medicine

## 2023-11-20 VITALS — BP 122/84 | HR 70 | Temp 98.5°F | Ht 71.0 in | Wt 133.0 lb

## 2023-11-20 DIAGNOSIS — R3915 Urgency of urination: Secondary | ICD-10-CM | POA: Diagnosis not present

## 2023-11-20 DIAGNOSIS — N3000 Acute cystitis without hematuria: Secondary | ICD-10-CM

## 2023-11-20 MED ORDER — CEPHALEXIN 250 MG PO CAPS
250.0000 mg | ORAL_CAPSULE | Freq: Three times a day (TID) | ORAL | 1 refills | Status: DC
Start: 2023-11-20 — End: 2024-02-06

## 2023-11-20 NOTE — Progress Notes (Signed)
 Subjective:    Patient ID: Nicole [name], female    DOB: [birth year], 43 y.o.   MRN: [id number]  HPI Here due to urinary symptoms  Started 5 days ago Urgency and frequency Uncomfortable after going---like still has to go Some urgency-then can barely go No fever, chills or sweats No hematuria  Azo helps some--to reduce symptoms  Frequent symptoms after sex  Current Outpatient Medications on File Prior to Visit  Medication Sig Dispense Refill   Cyanocobalamin  (VITAMIN B-12 PO) Take by mouth daily.     doxylamine, Sleep, (UNISOM) 25 MG tablet Take 25 mg by mouth at bedtime as needed.     FOLIC ACID  PO Take by mouth daily.     levothyroxine  (SYNTHROID ) 50 MCG tablet TAKE 1 TABLET BY MOUTH ONCE DAILY BEFORE BREAKFAST 90 tablet 1   Multiple Vitamin (MULTIVITAMIN WITH MINERALS) TABS tablet Take 1 tablet by mouth daily. 30 tablet 0   omeprazole  (PRILOSEC) 20 MG capsule TAKE 1 CAPSULE BY MOUTH TWICE DAILY BEFORE A MEAL 60 capsule 2   clindamycin  (CLINDAGEL) 1 % gel Apply topically daily. Apply think film to affected areas of breakouts daily (Patient not taking: Reported on 11/20/2023) 30 g 1   No current facility-administered medications on file prior to visit.    No Known Allergies  Past Medical History:  Diagnosis Date   Anemia    Dysrhythmia    Tacchy in HS.  Irregular with anemia.  Better now   Hypothyroidism    Kidney stones    Migraines    UTI (urinary tract infection)     Past Surgical History:  Procedure Laterality Date   COLONOSCOPY WITH PROPOFOL  N/A 11/17/2015   Procedure: COLONOSCOPY WITH PROPOFOL ;  Surgeon: Marnee Sink, MD;  Location: [facility];  Service: Endoscopy;  Laterality: N/A;   WISDOM TOOTH EXTRACTION      Family History  Problem Relation Age of Onset   Hypertension Mother    Bladder Cancer Maternal Grandmother     Social History   Socioeconomic History   Marital status: Married    Spouse name: Not on file   Number of children: Not  on file   Years of education: Not on file   Highest education level: Not on file  Occupational History   Not on file  Tobacco Use   Smoking status: Every Day    Current packs/day: 1.00    Average packs/day: 1 pack/day for 20.0 years (20.0 ttl pk-yrs)    Types: Cigarettes   Smokeless tobacco: Never  Substance and Sexual Activity   Alcohol use: Not Currently    Comment: HIstory 3 drinks per day, stopped 9/19; recently quit drinking as of 08/2018   Drug use: No   Sexual activity: Yes  Other Topics Concern   Not on file  Social History Narrative   Not on file   Social Drivers of Health   Financial Resource Strain: Not on file  Food Insecurity: Not on file  Transportation Needs: Not on file  Physical Activity: Not on file  Stress: Not on file  Social Connections: Not on file  Intimate Partner Violence: Not on file   Review of Systems No back pain  No N/V    Objective:   Physical Exam Constitutional:      Appearance: Normal appearance.  Abdominal:     Palpations: Abdomen is soft.     Tenderness: There is no abdominal tenderness. There is no right CVA tenderness or left CVA tenderness.  Neurological:  Mental Status: She is alert.            Assessment & Plan:

## 2023-11-20 NOTE — Assessment & Plan Note (Signed)
 Seems to be always post coital Discussed considering cranberry/vitamin C Will send culture (no U/A due to azo) Will Rx with cephalexin  250 tid x 7 days (can stop sooner if symptoms resolve quickly--like only 3 days if better tomorrow) Then can use 1 tab within an hour after sex as prophylaxix

## 2023-11-23 LAB — URINE CULTURE
MICRO NUMBER:: 16514452
SPECIMEN QUALITY:: ADEQUATE

## 2023-11-24 ENCOUNTER — Ambulatory Visit: Payer: Self-pay | Admitting: Internal Medicine

## 2023-11-27 ENCOUNTER — Other Ambulatory Visit: Payer: Self-pay | Admitting: Family Medicine

## 2023-11-27 DIAGNOSIS — E039 Hypothyroidism, unspecified: Secondary | ICD-10-CM

## 2023-11-27 NOTE — Telephone Encounter (Signed)
 Pt has only had acute appts recently, last f/u was in 2023, pt is due for CPE (labs prior: need to recheck thyroid ). Please schedule and then route back to me to refill

## 2023-12-01 ENCOUNTER — Other Ambulatory Visit: Payer: Self-pay | Admitting: Family Medicine

## 2023-12-01 DIAGNOSIS — E039 Hypothyroidism, unspecified: Secondary | ICD-10-CM

## 2023-12-01 NOTE — Telephone Encounter (Signed)
 lvm for pt to call office to schedule appt.

## 2023-12-01 NOTE — Telephone Encounter (Unsigned)
 Copied from CRM [id number]. Topic: Clinical - Medication Refill >> Dec 01, 2023  4:58 PM Kevelyn M wrote: Medication: levothyroxine  (SYNTHROID ) 50 MCG tablet  Has the patient contacted their pharmacy? Yes (Agent: If no, request that the patient contact the pharmacy for the refill. If patient does not wish to contact the pharmacy document the reason why and proceed with request.) (Agent: If yes, when and what did the pharmacy advise?)  This is the patient's preferred pharmacy:  [facility] [address] [place] - [address] [place] [address] Phone: [phone] Fax: [phone]   Is this the correct pharmacy for this prescription? Yes If no, delete pharmacy and type the correct one.   Has the prescription been filled recently? No  Is the patient out of the medication? No, one day left  Has the patient been seen for an appointment in the last year OR does the patient have an upcoming appointment? Yes  Can we respond through MyChart? Yes  Agent: Please be advised that Rx refills may take up to 3 business days. We ask that you follow-up with your pharmacy.

## 2023-12-02 MED ORDER — LEVOTHYROXINE SODIUM 50 MCG PO TABS: 50.0000 ug | ORAL_TABLET | Freq: Every day | ORAL | 0 refills | Status: DC

## 2023-12-04 NOTE — Telephone Encounter (Signed)
 Spoke with pt and pt stated that she will call later today and schedule appt cpe and f/u visit

## 2023-12-08 NOTE — Telephone Encounter (Signed)
 lvm for pt to call office to schedule appt.

## 2023-12-09 ENCOUNTER — Encounter: Payer: Self-pay | Admitting: Family Medicine

## 2023-12-09 NOTE — Telephone Encounter (Signed)
 3 attempts- letter sent

## 2023-12-22 ENCOUNTER — Ambulatory Visit: Admitting: Internal Medicine

## 2024-02-06 ENCOUNTER — Ambulatory Visit (INDEPENDENT_AMBULATORY_CARE_PROVIDER_SITE_OTHER): Admitting: Family Medicine

## 2024-02-06 ENCOUNTER — Encounter: Payer: Self-pay | Admitting: Family Medicine

## 2024-02-06 VITALS — BP 112/68 | HR 78 | Temp 99.2°F | Ht 69.25 in

## 2024-02-06 DIAGNOSIS — L600 Ingrowing nail: Secondary | ICD-10-CM | POA: Insufficient documentation

## 2024-02-06 DIAGNOSIS — F1090 Alcohol use, unspecified, uncomplicated: Secondary | ICD-10-CM | POA: Diagnosis not present

## 2024-02-06 DIAGNOSIS — Z79899 Other long term (current) drug therapy: Secondary | ICD-10-CM | POA: Diagnosis not present

## 2024-02-06 DIAGNOSIS — Z720 Tobacco use: Secondary | ICD-10-CM

## 2024-02-06 DIAGNOSIS — E039 Hypothyroidism, unspecified: Secondary | ICD-10-CM

## 2024-02-06 DIAGNOSIS — E538 Deficiency of other specified B group vitamins: Secondary | ICD-10-CM

## 2024-02-06 DIAGNOSIS — K219 Gastro-esophageal reflux disease without esophagitis: Secondary | ICD-10-CM

## 2024-02-06 DIAGNOSIS — L609 Nail disorder, unspecified: Secondary | ICD-10-CM

## 2024-02-06 DIAGNOSIS — L719 Rosacea, unspecified: Secondary | ICD-10-CM

## 2024-02-06 DIAGNOSIS — L602 Onychogryphosis: Secondary | ICD-10-CM | POA: Insufficient documentation

## 2024-02-06 NOTE — Assessment & Plan Note (Signed)
 Both great toe nails are thickened and ingrown  No signs of bacterial infection   Topical product did not help   Ref to podiatry

## 2024-02-06 NOTE — Assessment & Plan Note (Signed)
 Continues omerpazole 20 mg daily  Working well  Encouraged to avoid dietary triggers

## 2024-02-06 NOTE — Progress Notes (Signed)
 Subjective:    Patient ID: Nicole [name], female    DOB: Sep 02, 1980, 43 y.o.   MRN: [id number]  HPI  Wt Readings from Last 3 Encounters:  11/20/23 133 lb (60.3 kg)  08/12/23 140 lb 8 oz (63.7 kg)  06/07/22 142 lb 2 oz (64.5 kg)   19.50 kg/m  Vitals:   02/06/24 1605  BP: 112/68  Pulse: 78  Temp: 99.2 F (37.3 C)  SpO2: 98%     Pt presents for follow up of chronic health conditions including  Hypothyroidism GERD Rosacea  Alcohol use  B12 def  Ingrown toe nails   GERD Omeprazole  20 mg  Takes daily  In good control  Has to avoid raw onion / pepper    Hypothyroid Levothyroxine  50 mcg daily  Forgot this am (just one dose)  No clinical changes  Not sluggish  Pretty even      Rosacea  Prescription topical clindamycin  last visit -it did help  Not bad enough to use all the time  Uses sunscreen    Alcohol intake  Drinks on weekends -small bottle of wine each day  No liquor or beer   Occational 1-2 glasses of wine week day -not every day   Not motivated to cut down  Enjoys it too much     Smoking  15 cig per day  Knows she needs to quit   No breathing problems    Lab Results  Component Value Date   VITAMINB12 296 06/07/2022   Takes chewable supplement daily    She did start taking estroven   In grown great toe nails  Worse on right  Had to dig them out  Fungal  Used a topical product did not help   Patient Active Problem List   Diagnosis Date Noted   Current use of proton pump inhibitor 02/06/2024   Ingrown nail 02/06/2024   Thickened nails 02/06/2024   GERD (gastroesophageal reflux disease) 06/07/2022   Acne rosacea 10/12/2021   Nail abnormality 10/12/2021   Insomnia 08/25/2020   Alcohol use 08/25/2020   Noninfectious diarrhea    Vitamin B12 deficiency 10/25/2015   Hypothyroidism 10/25/2015   Alcohol abuse 10/25/2015   Nonspecific abnormal electrocardiogram (ECG) (EKG) 10/12/2015   Symptomatic anemia 10/12/2015    Tobacco abuse 06/01/2014   History of nephrolithiasis 06/01/2014   Past Medical History:  Diagnosis Date   Anemia    Dysrhythmia    Tacchy in HS.  Irregular with anemia.  Better now   Hypothyroidism    Kidney stones    Migraines    UTI (urinary tract infection)    Past Surgical History:  Procedure Laterality Date   COLONOSCOPY WITH PROPOFOL  N/A 11/17/2015   Procedure: COLONOSCOPY WITH PROPOFOL ;  Surgeon: Rogelia Copping, MD;  Location: [facility];  Service: Endoscopy;  Laterality: N/A;   WISDOM TOOTH EXTRACTION     Social History   Tobacco Use   Smoking status: Every Day    Current packs/day: 1.00    Average packs/day: 1 pack/day for 20.0 years (20.0 ttl pk-yrs)    Types: Cigarettes   Smokeless tobacco: Never  Substance Use Topics   Alcohol use: Not Currently    Comment: [employer]story 3 drinks per day, stopped 9/19; recently quit drinking as of 08/2018   Drug use: No   Family History  Problem Relation Age of Onset   Hypertension Mother    Bladder Cancer Maternal Grandmother    No Known Allergies Current Outpatient Medications on File  Prior to Visit  Medication Sig Dispense Refill   Cyanocobalamin  (VITAMIN B-12 PO) Take by mouth daily.     doxylamine, Sleep, (UNISOM) 25 MG tablet Take 25 mg by mouth at bedtime as needed.     FOLIC ACID  PO Take by mouth daily.     levothyroxine  (SYNTHROID ) 50 MCG tablet Take 1 tablet (50 mcg total) by mouth daily before breakfast. 90 tablet 0   Multiple Vitamin (MULTIVITAMIN WITH MINERALS) TABS tablet Take 1 tablet by mouth daily. 30 tablet 0   omeprazole  (PRILOSEC) 20 MG capsule TAKE 1 CAPSULE BY MOUTH TWICE DAILY BEFORE A MEAL 60 capsule 2   No current facility-administered medications on file prior to visit.    Review of Systems  Constitutional:  Negative for activity change, appetite change, fatigue, fever and unexpected weight change.  HENT:  Negative for congestion, ear pain, rhinorrhea, sinus pressure and sore throat.   Eyes:   Negative for pain, redness and visual disturbance.  Respiratory:  Negative for cough, shortness of breath and wheezing.   Cardiovascular:  Negative for chest pain and palpitations.  Gastrointestinal:  Negative for abdominal pain, blood in stool, constipation and diarrhea.  Endocrine: Negative for polydipsia and polyuria.  Genitourinary:  Negative for dysuria, frequency and urgency.  Musculoskeletal:  Negative for arthralgias, back pain and myalgias.  Skin:  Negative for pallor and rash.       Toe nail problems   Allergic/Immunologic: Negative for environmental allergies.  Neurological:  Negative for dizziness, syncope and headaches.  Hematological:  Negative for adenopathy. Does not bruise/bleed easily.  Psychiatric/Behavioral:  Negative for decreased concentration and dysphoric mood. The patient is not nervous/anxious.        Objective:   Physical Exam Constitutional:      General: She is not in acute distress.    Appearance: Normal appearance. She is well-developed.     Comments: Underweight   HENT:     Head: Normocephalic and atraumatic.     Mouth/Throat:     Mouth: Mucous membranes are moist.     Pharynx: Oropharynx is clear.  Eyes:     Conjunctiva/sclera: Conjunctivae normal.     Pupils: Pupils are equal, round, and reactive to light.  Neck:     Thyroid : No thyromegaly.     Vascular: No carotid bruit or JVD.  Cardiovascular:     Rate and Rhythm: Normal rate and regular rhythm.     Heart sounds: Normal heart sounds.     No gallop.     Comments: Plus one pedal pulses  Pulmonary:     Effort: Pulmonary effort is normal. No respiratory distress.     Breath sounds: Normal breath sounds. No stridor. No wheezing, rhonchi or rales.     Comments: Diffusely distant bs  Abdominal:     General: There is no distension or abdominal bruit.     Palpations: Abdomen is soft. There is no mass.     Tenderness: There is no abdominal tenderness.  Musculoskeletal:     Cervical back:  Normal range of motion and neck supple.     Right lower leg: No edema.     Left lower leg: No edema.  Lymphadenopathy:     Cervical: No cervical adenopathy.  Skin:    General: Skin is warm and dry.     Coloration: Skin is not pale.     Findings: No rash.     Comments: Great toe nails are thickened and ingrown  Worse on r  Neurological:     Mental Status: She is alert.     Coordination: Coordination normal.     Deep Tendon Reflexes: Reflexes are normal and symmetric. Reflexes normal.  Psychiatric:        Mood and Affect: Mood normal.           Assessment & Plan:   Problem List Items Addressed This Visit       Digestive   GERD (gastroesophageal reflux disease)   Continues omerpazole 20 mg daily  Working well  Encouraged to avoid dietary triggers         Endocrine   Hypothyroidism - Primary   Hypothyroidism  Pt has no clinical changes No change in energy level/ hair or skin/ edema and no tremor TSH today  Taking levothyroxine  50 mcg daily        Relevant Orders   TSH     Musculoskeletal and Integument   Nail abnormality   Thickened nails  Suspect fungal  Now ingrown   Used topical product without improvement  Would not recommend lamasil oral due to alcohol intake   Ref to podiatry       Ingrown nail   Both great toe nails are thickened and ingrown  No signs of bacterial infection   Topical product did not help   Ref to podiatry      Relevant Orders   Ambulatory referral to Podiatry     Other   Vitamin B12 deficiency   B12 level today  Taking supplement   On ppi daily      Relevant Orders   Comprehensive metabolic panel with GFR   Vitamin B12   CBC with Differential/Platelet   Tobacco abuse   Disc in detail risks of smoking and possible outcomes including copd, vascular/ heart disease, cancer , respiratory and sinus infections as well as osteoporosis  Pt voices understanding Pt is not ready to quit  No breathing issues    Discussed risk of osteoporosis with this and alcohol intake  Discussed fall prevention, supplements and exercise for bone density        Thickened nails   Suspect fungal  Referral to podiatry for this and ingrown status       Relevant Orders   Ambulatory referral to Podiatry   Current use of proton pump inhibitor   B12 and D levels added to labs today       Relevant Orders   Comprehensive metabolic panel with GFR   VITAMIN D  25 Hydroxy (Vit-D Deficiency, Fractures)   Vitamin B12   CBC with Differential/Platelet   Alcohol use   Currently drinking 1-2 glasses of wine on weekdays (some)  1 small bottle daily on weekends  Not motivated to quit   Discussed health reasons to cut back or quit       Relevant Orders   Comprehensive metabolic panel with GFR   CBC with Differential/Platelet   Acne rosacea   Topical clindamycin  was helpful  Has not needed treatment recently  Encouraged to keep using sunscreen

## 2024-02-06 NOTE — Assessment & Plan Note (Signed)
 Hypothyroidism  Pt has no clinical changes No change in energy level/ hair or skin/ edema and no tremor TSH today  Taking levothyroxine  50 mcg daily

## 2024-02-06 NOTE — Assessment & Plan Note (Signed)
 Suspect fungal  Referral to podiatry for this and ingrown status

## 2024-02-06 NOTE — Assessment & Plan Note (Signed)
 Disc in detail risks of smoking and possible outcomes including copd, vascular/ heart disease, cancer , respiratory and sinus infections as well as osteoporosis  Pt voices understanding Pt is not ready to quit  No breathing issues   Discussed risk of osteoporosis with this and alcohol intake  Discussed fall prevention, supplements and exercise for bone density

## 2024-02-06 NOTE — Assessment & Plan Note (Signed)
 Currently drinking 1-2 glasses of wine on weekdays (some)  1 small bottle daily on weekends  Not motivated to quit   Discussed health reasons to cut back or quit

## 2024-02-06 NOTE — Assessment & Plan Note (Signed)
 B12 and D levels added to labs today

## 2024-02-06 NOTE — Assessment & Plan Note (Signed)
 B12 level today  Taking supplement   On ppi daily

## 2024-02-06 NOTE — Patient Instructions (Addendum)
 Aim for 2000 international units of vitamin D3 daily  Labs pending-may change that  Check bottles to see how much you get   Labs today  We will make a plan based on results    Keep thinking about quitting smoking    I put the referral in for podiatry   Please let us  know if you don't hear in 1-2 weeks to set that up (mychart message or call or letter)    Consider cutting alcohol to 1 drink per day or less

## 2024-02-06 NOTE — Assessment & Plan Note (Signed)
 Thickened nails  Suspect fungal  Now ingrown   Used topical product without improvement  Would not recommend lamasil oral due to alcohol intake   Ref to podiatry

## 2024-02-06 NOTE — Assessment & Plan Note (Signed)
 Topical clindamycin  was helpful  Has not needed treatment recently  Encouraged to keep using sunscreen

## 2024-02-07 LAB — CBC WITH DIFFERENTIAL/PLATELET
Absolute Lymphocytes: 1914 {cells}/uL (ref 850–3900)
Absolute Monocytes: 574 {cells}/uL (ref 200–950)
Basophils Absolute: 41 {cells}/uL (ref 0–200)
Basophils Relative: 0.7 %
Eosinophils Absolute: 99 {cells}/uL (ref 15–500)
Eosinophils Relative: 1.7 %
HCT: 39.7 % (ref 35.0–45.0)
Hemoglobin: 13.2 g/dL (ref 11.7–15.5)
MCH: 32.4 pg (ref 27.0–33.0)
MCHC: 33.2 g/dL (ref 32.0–36.0)
MCV: 97.5 fL (ref 80.0–100.0)
MPV: 10.7 fL (ref 7.5–12.5)
Monocytes Relative: 9.9 %
Neutro Abs: 3173 {cells}/uL (ref 1500–7800)
Neutrophils Relative %: 54.7 %
Platelets: 189 Thousand/uL (ref 140–400)
RBC: 4.07 Million/uL (ref 3.80–5.10)
RDW: 13.8 % (ref 11.0–15.0)
Total Lymphocyte: 33 %
WBC: 5.8 Thousand/uL (ref 3.8–10.8)

## 2024-02-07 LAB — COMPREHENSIVE METABOLIC PANEL WITH GFR
AG Ratio: 1.5 (calc) (ref 1.0–2.5)
ALT: 47 U/L — ABNORMAL HIGH (ref 6–29)
AST: 93 U/L — ABNORMAL HIGH (ref 10–35)
Albumin: 4.2 g/dL (ref 3.6–5.1)
Alkaline phosphatase (APISO): 66 U/L (ref 31–125)
BUN: 11 mg/dL (ref 7–25)
CO2: 27 mmol/L (ref 20–32)
Calcium: 8.8 mg/dL (ref 8.6–10.2)
Chloride: 103 mmol/L (ref 98–110)
Creat: 0.81 mg/dL (ref 0.50–0.99)
Globulin: 2.8 g/dL (ref 1.9–3.7)
Glucose, Bld: 101 mg/dL — ABNORMAL HIGH (ref 65–99)
Potassium: 4.2 mmol/L (ref 3.5–5.3)
Sodium: 138 mmol/L (ref 135–146)
Total Bilirubin: 0.4 mg/dL (ref 0.2–1.2)
Total Protein: 7 g/dL (ref 6.1–8.1)
eGFR: 91 mL/min/1.73m2 (ref >=60)

## 2024-02-07 LAB — TSH: TSH: 5.74 m[IU]/L — ABNORMAL HIGH

## 2024-02-07 LAB — VITAMIN B12: Vitamin B-12: 315 pg/mL (ref 200–1100)

## 2024-02-07 LAB — VITAMIN D 25 HYDROXY (VIT D DEFICIENCY, FRACTURES): Vit D, 25-Hydroxy: 19 ng/mL — ABNORMAL LOW (ref 30–100)

## 2024-02-08 ENCOUNTER — Ambulatory Visit: Payer: Self-pay | Admitting: Family Medicine

## 2024-02-08 DIAGNOSIS — E039 Hypothyroidism, unspecified: Secondary | ICD-10-CM

## 2024-02-13 NOTE — Telephone Encounter (Unsigned)
 Copied from CRM #[id number]. Topic: Clinical - Lab/Test Results >> Feb 12, 2024  4:41 PM Chiquita SQUIBB wrote: Reason for CRM: Patient  is returning the call regarding lab results, read the patient the lab results and she has a few questions she would like to go over with this nurse regarding the lab results, she is also asking if the levothyroxine  has been sent over to the pharmacy. Please call the patient back.

## 2024-02-16 MED ORDER — LEVOTHYROXINE SODIUM 50 MCG PO TABS: 50.0000 ug | ORAL_TABLET | Freq: Every day | ORAL | 0 refills | Status: AC

## 2024-02-16 NOTE — Telephone Encounter (Signed)
 I sent thyroid  medicine Follow up in 3-4 weeks  Avoid both alcohol and tylenol  before then

## 2024-02-16 NOTE — Addendum Note (Signed)
 Addended by: RANDEEN HARDY A on: 02/16/2024 08:32 PM   Modules accepted: Orders

## 2024-05-14 ENCOUNTER — Telehealth: Payer: Self-pay | Admitting: *Deleted

## 2024-05-14 NOTE — Telephone Encounter (Signed)
 Pt is on the list of pt's that's due for a mammogram  Called pt and no answer so left VM requesting pt to call the office back to see if she would like to proceed with mammogram, we have a mobile mammogram pt can get done here on 05/28/24
